# Patient Record
Sex: Female | Born: 1965 | Race: White | Hispanic: No | Marital: Married | Smoking: Never smoker
Health system: Southern US, Community
[De-identification: ages and names within clinical notes are randomized; demographics above are authoritative.]

## PROBLEM LIST (undated history)

## (undated) DIAGNOSIS — T4145XA Adverse effect of unspecified anesthetic, initial encounter: Secondary | ICD-10-CM

## (undated) DIAGNOSIS — T7840XA Allergy, unspecified, initial encounter: Secondary | ICD-10-CM

## (undated) DIAGNOSIS — T8859XA Other complications of anesthesia, initial encounter: Secondary | ICD-10-CM

## (undated) DIAGNOSIS — D649 Anemia, unspecified: Secondary | ICD-10-CM

## (undated) DIAGNOSIS — Z8489 Family history of other specified conditions: Secondary | ICD-10-CM

## (undated) DIAGNOSIS — E079 Disorder of thyroid, unspecified: Secondary | ICD-10-CM

## (undated) DIAGNOSIS — M1990 Unspecified osteoarthritis, unspecified site: Secondary | ICD-10-CM

## (undated) DIAGNOSIS — J45909 Unspecified asthma, uncomplicated: Secondary | ICD-10-CM

## (undated) HISTORY — DX: Disorder of thyroid, unspecified: E07.9

## (undated) HISTORY — PX: SPINE SURGERY: SHX786

## (undated) HISTORY — PX: WISDOM TOOTH EXTRACTION: SHX21

## (undated) HISTORY — DX: Anemia, unspecified: D64.9

## (undated) HISTORY — PX: TRACHEOSTOMY: SUR1362

## (undated) HISTORY — DX: Unspecified asthma, uncomplicated: J45.909

## (undated) HISTORY — DX: Allergy, unspecified, initial encounter: T78.40XA

## (undated) HISTORY — PX: SPINAL FUSION: SHX223

---

## 1997-11-09 ENCOUNTER — Ambulatory Visit (HOSPITAL_COMMUNITY): Admission: RE | Admit: 1997-11-09 | Discharge: 1997-11-09 | Payer: Self-pay | Admitting: Obstetrics and Gynecology

## 1998-02-11 ENCOUNTER — Inpatient Hospital Stay (HOSPITAL_COMMUNITY): Admission: AD | Admit: 1998-02-11 | Discharge: 1998-02-13 | Payer: Self-pay | Admitting: Obstetrics and Gynecology

## 1998-03-23 ENCOUNTER — Other Ambulatory Visit: Admission: RE | Admit: 1998-03-23 | Discharge: 1998-03-23 | Payer: Self-pay | Admitting: Obstetrics and Gynecology

## 1998-09-09 ENCOUNTER — Other Ambulatory Visit: Admission: RE | Admit: 1998-09-09 | Discharge: 1998-09-09 | Payer: Self-pay | Admitting: Obstetrics and Gynecology

## 2002-08-20 ENCOUNTER — Encounter: Payer: Self-pay | Admitting: Family Medicine

## 2002-08-20 ENCOUNTER — Encounter: Admission: RE | Admit: 2002-08-20 | Discharge: 2002-08-20 | Payer: Self-pay | Admitting: Family Medicine

## 2003-09-15 ENCOUNTER — Emergency Department (HOSPITAL_COMMUNITY): Admission: EM | Admit: 2003-09-15 | Discharge: 2003-09-16 | Payer: Self-pay | Admitting: Emergency Medicine

## 2003-11-21 ENCOUNTER — Other Ambulatory Visit: Admission: RE | Admit: 2003-11-21 | Discharge: 2003-11-21 | Payer: Self-pay | Admitting: *Deleted

## 2003-11-21 ENCOUNTER — Other Ambulatory Visit: Admission: RE | Admit: 2003-11-21 | Discharge: 2003-11-21 | Payer: Self-pay | Admitting: Family Medicine

## 2004-04-29 ENCOUNTER — Encounter: Admission: RE | Admit: 2004-04-29 | Discharge: 2004-04-29 | Payer: Self-pay | Admitting: Family Medicine

## 2004-06-30 ENCOUNTER — Encounter: Admission: RE | Admit: 2004-06-30 | Discharge: 2004-06-30 | Payer: Self-pay | Admitting: Gastroenterology

## 2004-07-01 ENCOUNTER — Encounter (INDEPENDENT_AMBULATORY_CARE_PROVIDER_SITE_OTHER): Payer: Self-pay | Admitting: Specialist

## 2004-07-01 ENCOUNTER — Ambulatory Visit (HOSPITAL_COMMUNITY): Admission: RE | Admit: 2004-07-01 | Discharge: 2004-07-01 | Payer: Self-pay | Admitting: Gastroenterology

## 2004-07-19 ENCOUNTER — Encounter: Admission: RE | Admit: 2004-07-19 | Discharge: 2004-07-19 | Payer: Self-pay | Admitting: Gastroenterology

## 2004-12-27 ENCOUNTER — Emergency Department (HOSPITAL_COMMUNITY): Admission: EM | Admit: 2004-12-27 | Discharge: 2004-12-27 | Payer: Self-pay | Admitting: Emergency Medicine

## 2005-04-05 ENCOUNTER — Other Ambulatory Visit: Admission: RE | Admit: 2005-04-05 | Discharge: 2005-04-05 | Payer: Self-pay | Admitting: Family Medicine

## 2006-04-27 ENCOUNTER — Encounter: Admission: RE | Admit: 2006-04-27 | Discharge: 2006-04-27 | Payer: Self-pay | Admitting: Family Medicine

## 2006-05-18 ENCOUNTER — Other Ambulatory Visit: Admission: RE | Admit: 2006-05-18 | Discharge: 2006-05-18 | Payer: Self-pay | Admitting: Family Medicine

## 2007-05-21 ENCOUNTER — Other Ambulatory Visit: Admission: RE | Admit: 2007-05-21 | Discharge: 2007-05-21 | Payer: Self-pay | Admitting: Family Medicine

## 2007-05-24 ENCOUNTER — Encounter: Admission: RE | Admit: 2007-05-24 | Discharge: 2007-05-24 | Payer: Self-pay | Admitting: Family Medicine

## 2008-06-10 ENCOUNTER — Other Ambulatory Visit: Admission: RE | Admit: 2008-06-10 | Discharge: 2008-06-10 | Payer: Self-pay | Admitting: Family Medicine

## 2010-03-04 ENCOUNTER — Ambulatory Visit (HOSPITAL_BASED_OUTPATIENT_CLINIC_OR_DEPARTMENT_OTHER): Admission: RE | Admit: 2010-03-04 | Discharge: 2010-03-04 | Payer: Self-pay | Admitting: Orthopedic Surgery

## 2010-11-05 NOTE — Op Note (Signed)
Tracy Ross, HEINRICHS NO.:  [id number]   MEDICAL RECORD NO.:  [id number]          PATIENT TYPE:  AMB   LOCATION:  ENDO                         FACILITY:  [facility]   PHYSICIAN:  Petra Kuba, M.D.    DATE OF BIRTH:  10/02/1965   DATE OF PROCEDURE:  07/01/2004  DATE OF DISCHARGE:                                 OPERATIVE REPORT   PROCEDURE:  Esophagogastroduodenoscopy with biopsy.   INDICATIONS:  Abdominal pain, nondiagnostic ultrasound and labs.  Consent  was signed after risks, benefits, methods, and options thoroughly discussed  in the office.   MEDICINES USED:  Demerol 25 mg, Versed 4 mg.   PROCEDURE:  The video endoscope was inserted by direct vision.  The  esophagus was normal.  She did have a tiny hiatal hernia.  The scope passed  into the stomach, advanced to the antrum, where some minimal antritis was  seen, through a normal pylorus, into a normal duodenal bulb, and around the  C-loop to a normal second portion of the duodenum.  The scope was withdrawn  back to the bulb and a good look there ruled out ulcers in that location.  The scope was withdrawn back to the stomach and retroflexed.  High in the  cardia, the hiatal hernia was confirmed.  The fundus, angularis, lesser and  greater curve were normal on retroflexed visualization except for some  minimal gastritis.  Straight visualization of the stomach confirmed the  minimal gastritis.  Air was suctioned after two biopsies of the antrum and  two of the proximal stomach were obtained to rule out Helicobacter.  The  scope was slowly withdrawn again.  A good look at the esophagus was normal.  The scope was removed.  The patient tolerated the procedure well.  There was  no obvious immediate complication.   ENDOSCOPIC DIAGNOSES:  1.  Tiny hiatal hernia.  2.  Minimal gastritis and antritis, status post biopsy.  3.  Otherwise normal EGD.   PLAN:  Will await pathology, follow up p.r.n. or in two months.   Otherwise,  continue pump inhibitors.  Consider a CT, PIPIDA, gastric emptying as  possible other test to consider in the future if her problems continue.      MEM/MEDQ  D:  07/01/2004  T:  07/01/2004  Job:  [id number]   cc:   Molly Maduro L. Foy Guadalajara, M.D.  [address] [place]  [place] [address]  Fax: [phone]

## 2010-12-14 ENCOUNTER — Emergency Department (HOSPITAL_COMMUNITY)
Admission: EM | Admit: 2010-12-14 | Discharge: 2010-12-14 | Disposition: A | Discharge status: Home / Self Care | Payer: BC Managed Care – PPO | Attending: Emergency Medicine | Admitting: Emergency Medicine

## 2010-12-14 DIAGNOSIS — T63481A Toxic effect of venom of other arthropod, accidental (unintentional), initial encounter: Secondary | ICD-10-CM | POA: Insufficient documentation

## 2010-12-14 DIAGNOSIS — T6391XA Toxic effect of contact with unspecified venomous animal, accidental (unintentional), initial encounter: Secondary | ICD-10-CM | POA: Insufficient documentation

## 2010-12-14 DIAGNOSIS — L500 Allergic urticaria: Secondary | ICD-10-CM | POA: Insufficient documentation

## 2011-07-14 ENCOUNTER — Ambulatory Visit
Admission: RE | Admit: 2011-07-14 | Discharge: 2011-07-14 | Disposition: A | Discharge status: Home / Self Care | Payer: BC Managed Care – PPO | Source: Ambulatory Visit | Attending: Chiropractic Medicine | Admitting: Chiropractic Medicine

## 2011-07-14 ENCOUNTER — Other Ambulatory Visit: Payer: Self-pay | Admitting: Chiropractic Medicine

## 2011-07-14 ENCOUNTER — Other Ambulatory Visit: Payer: Self-pay | Admitting: Family Medicine

## 2011-07-14 DIAGNOSIS — Z1231 Encounter for screening mammogram for malignant neoplasm of breast: Secondary | ICD-10-CM

## 2011-07-14 DIAGNOSIS — M25551 Pain in right hip: Secondary | ICD-10-CM

## 2011-07-14 DIAGNOSIS — M25659 Stiffness of unspecified hip, not elsewhere classified: Secondary | ICD-10-CM

## 2011-07-28 ENCOUNTER — Ambulatory Visit
Admission: RE | Admit: 2011-07-28 | Discharge: 2011-07-28 | Disposition: A | Discharge status: Home / Self Care | Payer: BC Managed Care – PPO | Source: Ambulatory Visit | Attending: Family Medicine | Admitting: Family Medicine

## 2011-07-28 DIAGNOSIS — Z1231 Encounter for screening mammogram for malignant neoplasm of breast: Secondary | ICD-10-CM

## 2012-05-24 ENCOUNTER — Ambulatory Visit (INDEPENDENT_AMBULATORY_CARE_PROVIDER_SITE_OTHER): Payer: BC Managed Care – PPO | Admitting: Sports Medicine

## 2012-05-24 ENCOUNTER — Encounter: Payer: Self-pay | Admitting: Sports Medicine

## 2012-05-24 VITALS — BP 129/73 | HR 64 | Ht 63.0 in | Wt 139.0 lb

## 2012-05-24 DIAGNOSIS — IMO0002 Reserved for concepts with insufficient information to code with codable children: Secondary | ICD-10-CM

## 2012-05-24 DIAGNOSIS — M5416 Radiculopathy, lumbar region: Secondary | ICD-10-CM | POA: Insufficient documentation

## 2012-05-24 MED ORDER — PREDNISONE 50 MG PO TABS: ORAL_TABLET | ORAL | Status: DC

## 2012-05-24 MED ORDER — MELOXICAM 15 MG PO TABS: ORAL_TABLET | ORAL | Status: DC

## 2012-05-24 MED ORDER — CYCLOBENZAPRINE HCL 10 MG PO TABS: ORAL_TABLET | ORAL | Status: DC

## 2012-05-24 NOTE — Assessment & Plan Note (Signed)
Right-sided L3-L4 lumbar radiculitis, by exam likely affecting the right L3 nerve root. She also has weak abductors on the right side. We will start conservatively, formal physical therapy, prednisone, Flexeril, Mobic. On also like her to work on some specific hip abductor rehabilitation exercises. We will see her back in approximately 4 weeks, and if no better at that point we can consider interventional injection therapy, likely the right L3-L4 level.

## 2012-05-24 NOTE — Patient Instructions (Addendum)
Hip Rehabilitation Protocol:  1.  Side leg raises.  3x30 with no weight, then 3x15 with 2 lb ankle weight, then 3x15 with 5 lb ankle weight 2.  Standing hip rotation.  3x30 with no weight, then 3x15 with 2 lb ankle weight, then 3x15 with 5 lb ankle weight. 3.  Side step ups.  3x30 with no weight, then 3x15 with 5 lbs in backpack, then 3x15 with 10 lbs in backpack. 

## 2012-05-24 NOTE — Progress Notes (Addendum)
SPORTS MEDICINE CONSULTATION REPORT  Subjective:    I'm seeing this patient as a consultation for:  Dr. Foy Guadalajara  CC: Back and hip pain  HPI: This is a very pleasant 46 year old female who comes in with an over one year history of pain she localizes in her low back, predominantly on the right side, it used to radiate around her buttock, and around her anterior thigh. She notes it particularly bad with flexion, squats, and long car rides. It also tends to bother her at night. She also has worse pain with long periods of sitting, and prefers to stand.  She's only been using Motrin, and has never undergone physical therapy, injections, or any steroids. The pain is localized, radiates as above, is moderate  Past medical history, Surgical history, Family history, Social history, Allergies, and medications have been entered into the medical record, reviewed, and no changes needed. No pertinant family history needed.  Review of Systems: No headache, visual changes, nausea, vomiting, diarrhea, constipation, dizziness, abdominal pain, skin rash, fevers, chills, night sweats, weight loss, swollen lymph nodes, body aches, joint swelling, muscle aches, chest pain, shortness of breath, mood changes, visual or auditory hallucinations.   Objective:   Vitals:  Afebrile, vital signs stable. General: Well Developed, well nourished, and in no acute distress.  Neuro/Psych: Alert and oriented x3, extra-ocular muscles intact, able to move all 4 extremities.  Skin: Warm and dry, no rashes noted.  Respiratory: Not using accessory muscles, speaking in full sentences, trachea midline.  Cardiovascular: Pulses palpable, no extremity edema. Abdomen: Does not appear distended. Back Exam:  Inspection: Unremarkable  Motion: Flexion 45 deg, Extension 45 deg, Side Bending to 45 deg bilaterally,  Rotation to 45 deg bilaterally  SLR laying: Negative  XSLR laying: Negative  Palpable tenderness: Bilateral posterior superior  iliac spine.Marland Kitchen FABER: negative. Sensory change: Gross sensation intact to all lumbar and sacral dermatomes.  Reflexes: 2+ at both patellar tendons, 2+ at achilles tendons, Babinski's downgoing.  Strength at foot  Plantar-flexion: 5/5 Dorsi-flexion: 5/5 Eversion: 5/5 Inversion: 5/5  Leg strength  Quad: 5/5 Hamstring: 5/5 Hip flexor: 5/5 Hip abductors: 5/5  Gait unremarkable.  Bilateral Hip: ROM IR: 45 Deg, ER: 45 Deg, Flexion: 120 Deg, Extension: 100 Deg, Abduction: 45 Deg, Adduction: 45 Deg Strength IR: 5/5, ER: 5/5, Flexion: 5/5, Extension: 5/5, hip abduction is very weak on the right side when compared to the left, Adduction: 5/5  Pelvic alignment unremarkable to inspection and palpation. Standing hip rotation and gait without trendelenburg sign / unsteadiness. Greater trochanter without tenderness to palpation. No tenderness over piriformis and greater trochanter. No pain with FABER or FADIR. No SI joint tenderness and normal minimal SI movement.  I did review her MRI, it shows L3-L4 far lateral right sided disc protrusion likely affecting the exiting right L3 nerve root. There is also multilevel facet arthrosis.  She also has a pelvic MRI that shows a small paralabral cyst on her right acetabular rim, there is no degenerative change noted on MRI.  I also reviewed her pelvic x-rays, they show mild shallowness of the right acetabulum, over the superior acetabular rim coverage greater than 50% of the femoral head. This is not representative of developmental hip dysplasia.   Impression and Recommendations:   This case required medical decision making of moderate complexity.

## 2012-05-28 ENCOUNTER — Ambulatory Visit: Payer: BC Managed Care – PPO | Attending: Sports Medicine

## 2012-05-28 DIAGNOSIS — IMO0001 Reserved for inherently not codable concepts without codable children: Secondary | ICD-10-CM | POA: Insufficient documentation

## 2012-05-28 DIAGNOSIS — M545 Low back pain, unspecified: Secondary | ICD-10-CM | POA: Insufficient documentation

## 2012-05-28 DIAGNOSIS — R5381 Other malaise: Secondary | ICD-10-CM | POA: Insufficient documentation

## 2012-05-28 DIAGNOSIS — M25569 Pain in unspecified knee: Secondary | ICD-10-CM | POA: Insufficient documentation

## 2012-05-31 ENCOUNTER — Ambulatory Visit: Payer: BC Managed Care – PPO

## 2012-06-04 ENCOUNTER — Ambulatory Visit: Payer: BC Managed Care – PPO

## 2012-06-14 ENCOUNTER — Other Ambulatory Visit: Payer: Self-pay | Admitting: Sports Medicine

## 2012-06-14 ENCOUNTER — Ambulatory Visit: Payer: BC Managed Care – PPO | Admitting: Physical Therapy

## 2012-06-15 ENCOUNTER — Telehealth: Payer: Self-pay | Admitting: *Deleted

## 2012-06-15 ENCOUNTER — Other Ambulatory Visit: Payer: Self-pay | Admitting: Sports Medicine

## 2012-06-15 NOTE — Telephone Encounter (Signed)
Lets hold off on this for now, the reason being if she is not improved after the initial course of treatment, the next step is delivering a medicine just like prednisone right at the nerve root in the spine. I don't want to give her too much steroid before this and I anticipate this is where we will need to go. She should keep her existing followup visit which should be 4 weeks after her previous visit with me.

## 2012-06-15 NOTE — Telephone Encounter (Signed)
Pt states when she took the Prednisone she felt really well. She states that she started feeling bad after the Prednisone. She is asking if she could take more Prednisone or what you think she do? States her back is feeling better and that it is more her hip. States she can't run on pavement still. Please advise.

## 2012-06-15 NOTE — Telephone Encounter (Signed)
LMOM

## 2012-06-21 ENCOUNTER — Encounter: Payer: Self-pay | Admitting: Sports Medicine

## 2012-06-21 ENCOUNTER — Ambulatory Visit (INDEPENDENT_AMBULATORY_CARE_PROVIDER_SITE_OTHER): Payer: BC Managed Care – PPO | Admitting: Sports Medicine

## 2012-06-21 VITALS — BP 146/83 | HR 75 | Wt 144.0 lb

## 2012-06-21 DIAGNOSIS — IMO0002 Reserved for concepts with insufficient information to code with codable children: Secondary | ICD-10-CM

## 2012-06-21 DIAGNOSIS — M2480 Other specific joint derangements of unspecified joint, not elsewhere classified: Secondary | ICD-10-CM

## 2012-06-21 DIAGNOSIS — M5416 Radiculopathy, lumbar region: Secondary | ICD-10-CM

## 2012-06-21 DIAGNOSIS — M24859 Other specific joint derangements of unspecified hip, not elsewhere classified: Secondary | ICD-10-CM | POA: Insufficient documentation

## 2012-06-21 NOTE — Progress Notes (Signed)
SPORTS MEDICINE CONSULTATION REPORT  Subjective:    CC: Followup  HPI: Right buttock pain: Tracy Ross comes back after formal physical therapy. To recap, she has had an MRI of her pelvis that shows a right para-labial cyst that may have resulted from an old hip labral injury likely from cheerleading. She also has multilevel degenerative as well as facet disease most predominant finding is a right-sided L3-L4 disc protrusion likely affecting the exiting nerve root. She also has multilevel bilateral facet arthrosis particularly at the L4-5 and L5-S1 levels. She localizes her pain deep within the right buttock, and notes it's worse with running, as well as with long car rides and sitting for long periods of time. The pain does radiate down the posterior buttock but not past the knee.  Past medical history, Surgical history, Family history, Social history, Allergies, and medications have been entered into the medical record, reviewed, and no changes needed.   Review of Systems: No headache, visual changes, nausea, vomiting, diarrhea, constipation, dizziness, abdominal pain, skin rash, fevers, chills, night sweats, weight loss, swollen lymph nodes, body aches, joint swelling, muscle aches, chest pain, shortness of breath, mood changes, visual or auditory hallucinations.   Objective:   Vitals:  Afebrile, vital signs stable. General: Well Developed, well nourished, and in no acute distress.  Neuro/Psych: Alert and oriented x3, extra-ocular muscles intact, able to move all 4 extremities.  Skin: Warm and dry, no rashes noted.  Respiratory: Not using accessory muscles, speaking in full sentences, trachea midline.  Cardiovascular: Pulses palpable, no extremity edema. Abdomen: Does not appear distended. Right Hip: ROM IR: 45 Deg, ER: 45 Deg, Flexion: 120 Deg, Extension: 100 Deg, Abduction: 45 Deg, Adduction: 45 Deg Internal rotation at neutral position does not cause pain, but we flex the hip slightly  internally rotate this does reproduce some of her pain. Strength IR: 5/5, ER: 5/5, Flexion: 5/5, Extension: 5/5, Abduction: 5/5, Adduction: 5/5 Pelvic alignment unremarkable to inspection and palpation. Standing hip rotation and gait without trendelenburg sign / unsteadiness. Greater trochanter without tenderness to palpation. No tenderness over piriformis and greater trochanter. No pain with FABER. She does have some reproduction of pain with flexion adduction and internal rotation. No SI joint tenderness and normal minimal SI movement.  Procedure: Real-time Ultrasound Guided Injection of right femoral acetabular joint Device: GE Logiq E  Ultrasound guided injection is preferred based studies that show increased duration, increased effect, greater accuracy, decreased procedural pain, increased response rate, and decreased cost with ultrasound guided versus blind injection.  Verbal informed consent obtained.  Time-out conducted.  Noted no overlying erythema, induration, or other signs of local infection.  Skin prepped in a sterile fashion.  Local anesthesia: Topical Ethyl chloride.  With sterile technique and under real time ultrasound guidance:  Spinal needle guided down to the femoral head/neck junction, 1 cc Kenalog 40, 4 cc lidocaine injected easily. Completed without difficulty  Pain immediately resolved suggesting accurate placement of the medication.  Advised to call if fevers/chills, erythema, induration, drainage, or persistent bleeding.  Images permanently stored and available for review in the ultrasound unit.  Impression: Technically successful ultrasound guided injection.  Impression and Recommendations:   This case required medical decision making of moderate complexity.

## 2012-06-21 NOTE — Assessment & Plan Note (Signed)
Intra-articular injection relieved all symptoms! This confirms hip intra-articular pathology as ain generating structure. Come back to see me in 4 weeks.

## 2012-06-21 NOTE — Assessment & Plan Note (Signed)
We can place this on the back burner for now as the intra-articular hip injection relieved all of her symptoms.

## 2012-07-19 ENCOUNTER — Ambulatory Visit: Payer: BC Managed Care – PPO | Admitting: Sports Medicine

## 2012-12-26 ENCOUNTER — Ambulatory Visit (INDEPENDENT_AMBULATORY_CARE_PROVIDER_SITE_OTHER): Payer: BC Managed Care – PPO | Admitting: Sports Medicine

## 2012-12-26 ENCOUNTER — Encounter: Payer: Self-pay | Admitting: Sports Medicine

## 2012-12-26 VITALS — BP 134/80 | Ht 63.0 in | Wt 135.0 lb

## 2012-12-26 DIAGNOSIS — M5416 Radiculopathy, lumbar region: Secondary | ICD-10-CM

## 2012-12-26 DIAGNOSIS — S76011A Strain of muscle, fascia and tendon of right hip, initial encounter: Secondary | ICD-10-CM

## 2012-12-26 DIAGNOSIS — G8929 Other chronic pain: Secondary | ICD-10-CM

## 2012-12-26 DIAGNOSIS — M24851 Other specific joint derangements of right hip, not elsewhere classified: Secondary | ICD-10-CM

## 2012-12-26 DIAGNOSIS — S76311A Strain of muscle, fascia and tendon of the posterior muscle group at thigh level, right thigh, initial encounter: Secondary | ICD-10-CM

## 2012-12-26 DIAGNOSIS — M2480 Other specific joint derangements of unspecified joint, not elsewhere classified: Secondary | ICD-10-CM

## 2012-12-26 DIAGNOSIS — M25559 Pain in unspecified hip: Secondary | ICD-10-CM

## 2012-12-26 DIAGNOSIS — IMO0002 Reserved for concepts with insufficient information to code with codable children: Secondary | ICD-10-CM

## 2012-12-26 NOTE — Patient Instructions (Addendum)
You have a small tear in your gluteus maximus and piriformis muscles. It is okay to continue activity as long as it does not increase pain. Advil as needed. Start doing exercises at home or gym working up to 3 sets of 15. Then add ankle weights when you are ready. Avoid doing any stretches, yoga, etc that increases front hip pain.

## 2012-12-26 NOTE — Progress Notes (Signed)
CC: Right anterolateral hip pain and posterior buttocks pain HPI: Patient is a 47 y/o female who presents for 1.5 yrs of right anterolateral hip pain and posterior buttocks pain. She states that she thinks the pain started in Nov 2012 when she was doing body pump classes and also training for a half marathon. She states that initially her pain was very severe, but continues to be bothersome and has never resolved. Going up stairs and running on hard surfaces such as pavement make her pain worse. She has taken about 2 weeks off of exercise while her husband was in hospital without any improvement in her pain. She states that she sometimes feels like she gets a hard knot in her buttocks just below her iliac crest on the right. She is going yoga and stretching. Pain does sometimes wake her up at night. She was seen about 6 months ago after undergoing MRI that showed a prelabral cyst and had a intraarticular hip injection that relieved all of her symptoms. She is requesting another injection today. Patient is very concerned because she will be leaving to go hiking in [place] at the end of the month. Of note, MRI lumbar spine at that time showed multilevel degenerative as well as facet disease most predominant finding is a right-sided L3-L4 disc protrusion likely affecting the exiting nerve root. She also has multilevel bilateral facet arthrosis particularly at the L4-5 and L5-S1 levels.   ROS: As above in the HPI. All other systems are stable or negative.  OBJECTIVE: APPEARANCE:  Patient in no acute distress.The patient appeared well nourished and normally developed. HEENT: No scleral icterus. Conjunctiva non-injected RESP: No accessory muscle use, non-labored MSK: Non-antalgic gait. Patient noted to remain standing throughout encounter which she says makes her more comfortable. Right hip: Good ROM in hip flexion, extension, ext rotation. Mild loss of IR due to pain. No TTP of anterior hip joint. Pain  free 5/5 strength on resisted hip flexion, abduction, adduction. 5/5 strength of glut medius. 3/5 strength of glut maximus. TTP of piriformis. There is also TTP in body of right glut maximus with palpable tissue density.   MSK U/S: 1. Right buttocks: There is a visualized hypoechoic area in the body of the right glut maximus with apparent muscle fiber disruption at site of patients pain, visualized in both short and long axis. There is also visualized hypoechoic changed with evidence of muscle splitting in the right piriformis muscle. 2. Right hip joint: There is a small tear in labrum with prelabral cyst. No surrounding hip joint effusion.   ASSESSMENT: 1. Right gluteus maximus muscle tear 2. Right piriformis muscle tear 3. 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 Gluteus medius and piriformis muscle tears. Likely occurred during squatting activities in exercise class. Avoid doing any activities that increase pain. Start HEP to improve gluteus maximus and hip stabilizer strength. Work up to [employer] of 15 and add ankle weights. 2. Right hip prelabral cyst. Do not suspect intraarticular pathology as main source of patient's pain today so recommended against joint injection. Avoid activities with end range internal rotation that cause pain.

## 2012-12-27 DIAGNOSIS — G8929 Other chronic pain: Secondary | ICD-10-CM | POA: Insufficient documentation

## 2012-12-27 DIAGNOSIS — M25559 Pain in unspecified hip: Secondary | ICD-10-CM | POA: Insufficient documentation

## 2012-12-27 NOTE — Assessment & Plan Note (Signed)
This did not seem to contribute to sxs today

## 2012-12-27 NOTE — Assessment & Plan Note (Signed)
See exercise plan  She has signficant weakness that we can correct

## 2012-12-27 NOTE — Assessment & Plan Note (Signed)
Noted on [place] but not symptomatic except on FADIR exam

## 2013-01-14 ENCOUNTER — Ambulatory Visit (INDEPENDENT_AMBULATORY_CARE_PROVIDER_SITE_OTHER): Payer: BC Managed Care – PPO | Admitting: Sports Medicine

## 2013-01-14 VITALS — BP 148/89 | Ht 63.0 in | Wt 135.0 lb

## 2013-01-14 DIAGNOSIS — M5416 Radiculopathy, lumbar region: Secondary | ICD-10-CM

## 2013-01-14 DIAGNOSIS — S76011A Strain of muscle, fascia and tendon of right hip, initial encounter: Secondary | ICD-10-CM

## 2013-01-14 DIAGNOSIS — IMO0002 Reserved for concepts with insufficient information to code with codable children: Secondary | ICD-10-CM

## 2013-01-14 MED ORDER — MELOXICAM 15 MG PO TABS: ORAL_TABLET | ORAL | Status: DC

## 2013-01-14 MED ORDER — PREDNISONE (PAK) 10 MG PO TABS: 10.0000 mg | ORAL_TABLET | Freq: Every day | ORAL | Status: DC

## 2013-01-14 MED ORDER — PREDNISONE 10 MG PO TABS: ORAL_TABLET | ORAL | Status: DC

## 2013-01-14 MED ORDER — METHYLPREDNISOLONE ACETATE 80 MG/ML IJ SUSP
80.0000 mg | Freq: Once | INTRAMUSCULAR | Status: AC
  Administered 2013-01-14: 80 mg via INTRAMUSCULAR

## 2013-01-14 NOTE — Progress Notes (Signed)
  Subjective:    Patient ID: Tracy Ross, female    DOB: [birth year], 47 y.o.   MRN: [id number]  HPI  Patient comes in today with persistent posterior right hip pain. Patient is new to me but is a well-established patient of this clinic. Please see the previous office notes for detailed history. In short, she's had chronic posterior right hip pain. She's had MRI scans of both the right hip as well as her lumbar spine. Her current pain is in the posterior hip and an ultrasound done by Dr. Darrick Penna on July 9th showed tearing of the right gluteus maximus muscle as well as the right piriformis muscle. She was given some home exercises to do. Despite this her symptoms persist. She is leaving for a trip to hike the [facility] later this week and she would be interested in a cortisone injection. She has previously undergone an intra-articular cortisone injection in the same hip but she currently denies any groin pain. Again she localizes all the pain to the posterior hip. Although she has experienced radiating pain down the leg previously she denies any radiculopathy today. Been taking Mobic with some benefit. Her symptoms are most noticeable with prolonged running or with repetitive bending at the waist. No associated numbness or tingling.  Interim medical history is unchanged    Review of Systems     Objective:   Physical Exam Well-developed, well-nourished. No acute distress. Awake alert and oriented x3. Vital signs are reviewed.  Right hip: Smooth painless hip range of motion with a negative log roll in the sitting position. There is tenderness to palpation along the body of the right gluteus maximus as well as some spasm in this area. 3-4/5 strength in the gluteus maximus. 5/5 strength through the rest of the hip. Negative straight leg raise. Walking without significant limp.  MRI scans of her lumbar spine as well as of the right hip are reviewed. Images are dated 09/28/2011. Hip MRI shows  only a tiny para labral cyst. Lumbar spine MRI shows a right-sided disc bulge at L3-L4 which may affect the L3 nerve root. There is also facet arthropathy at L4-L5.       Assessment & Plan:  1. Posterior right hip pain with ultrasound evidence of tearing of the gluteus maximus muscle and piriformis muscle 2. MRI evidence of L4-L5 facet arthropathy and L3-L4 right-sided disc bulge  Patient is injected with 80 mg of Depo-Medrol IM today. I've asked that she continue on her Mobic for a few more days. Continue with her home exercises as well. I've given her a prescription for a 6 day Sterapred Dosepak to take with her on her trip but she will not start this medication unless her symptoms become severe. Followup with Dr. Darrick Penna as scheduled in a few weeks. I do not think her symptoms are originating from her hip so I would not recommend repeating an intra-articular cortisone injection. However, the disc bulge at L3-L4 could be a contributing factor in the chronicity of her symptoms. I've asked her to pay attention to her symptoms and let us know if she gets any radiculopathy down the right leg.

## 2013-02-12 ENCOUNTER — Ambulatory Visit: Payer: BC Managed Care – PPO | Admitting: Sports Medicine

## 2013-03-06 ENCOUNTER — Ambulatory Visit (INDEPENDENT_AMBULATORY_CARE_PROVIDER_SITE_OTHER): Payer: BC Managed Care – PPO | Admitting: Sports Medicine

## 2013-03-06 VITALS — BP 120/60 | Ht 63.0 in

## 2013-03-06 DIAGNOSIS — M5416 Radiculopathy, lumbar region: Secondary | ICD-10-CM

## 2013-03-06 DIAGNOSIS — M24851 Other specific joint derangements of right hip, not elsewhere classified: Secondary | ICD-10-CM

## 2013-03-06 DIAGNOSIS — M25559 Pain in unspecified hip: Secondary | ICD-10-CM

## 2013-03-06 DIAGNOSIS — IMO0002 Reserved for concepts with insufficient information to code with codable children: Secondary | ICD-10-CM

## 2013-03-06 DIAGNOSIS — G8929 Other chronic pain: Secondary | ICD-10-CM

## 2013-03-06 DIAGNOSIS — M2480 Other specific joint derangements of unspecified joint, not elsewhere classified: Secondary | ICD-10-CM

## 2013-03-06 MED ORDER — TRAMADOL HCL 50 MG PO TABS: 50.0000 mg | ORAL_TABLET | Freq: Two times a day (BID) | ORAL | Status: DC | PRN

## 2013-03-06 MED ORDER — NITROGLYCERIN 0.2 MG/HR TD PT24: MEDICATED_PATCH | TRANSDERMAL | Status: DC

## 2013-03-06 NOTE — Assessment & Plan Note (Signed)
This does not seem to be radiating pain on today's visit

## 2013-03-06 NOTE — Patient Instructions (Addendum)
Nitroglycerin Protocol   Apply 1/4 nitroglycerin patch to affected area daily.  Change position of patch within the affected area every 24 hours.  You may experience a headache during the first 1-2 weeks of using the patch, these should subside.  If you experience headaches after beginning nitroglycerin patch treatment, you may take your preferred over the counter pain reliever.  Another side effect of the nitroglycerin patch is skin irritation or rash related to patch adhesive.  Please notify our office if you develop more severe headaches or rash, and stop the patch.  Tendon healing with nitroglycerin patch may require 12 to 24 weeks depending on the extent of injury.  Men should not use if taking Viagra, Cialis, or Levitra.   Do not use if you have migraines or rosacea.   Ok to try massage  Tramadol as needed for pain twice daily  Please follow up in 6 weeks  Thank you for seeing [place] today!

## 2013-03-06 NOTE — Assessment & Plan Note (Signed)
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

## 2013-03-06 NOTE — Progress Notes (Signed)
Patient ID: Tracy Ross, female   DOB: 10-16-1965, 47 y.o.   MRN: [id number]  Patient returns for followup of right hip pain that has been chronic for the last 20 months On Her ultrasound I noted some tearing in the gluteus medius and piriformis on last visit. She did have a right para-labral cyst at her hip but I did not think this seemed to be causing her symptoms. Last year she had a hip joint injection and that helped her symptoms significantly.  Before going on a hiking trip 6 weeks again she also had an injection of cortisone that also relieved a lot of her hip symptoms.  Most of her hip pain is with external rotation and motion and localizes to the lateral hip. She does not have groin pain. She does note that Mobic helps but after a busy day she will also add some Tylenol.  Physical examination No acute distress, athletic female  Range of motion of the left hip is pain free and normal All strength testing on the left is normal  Range of motion of the right hip is normal but she does get some pain on external rotation of the hip that localizes to the greater trochanter Palpation reveals some tenderness over the piriformis and over the upper Glut. Maximus. She has mild tenderness over the tensor fascil lata. Spasm over upper Glut Max Strength on abduction and flexion of the hip are normal With hip extension resisted abduction is weak on the right and somewhat painful Reverse straight leg raises strong TFL testing is strong  Negative straight leg raise No neurologic weakness

## 2013-03-06 NOTE — Assessment & Plan Note (Signed)
I don't think this is the source of her current symptoms as she has less pain with internal rotation  This may cause some limitation of hip flexion on the right

## 2013-04-09 ENCOUNTER — Ambulatory Visit: Payer: BC Managed Care – PPO | Admitting: Sports Medicine

## 2013-06-10 ENCOUNTER — Other Ambulatory Visit: Payer: Self-pay | Admitting: Sports Medicine

## 2013-11-28 ENCOUNTER — Ambulatory Visit (INDEPENDENT_AMBULATORY_CARE_PROVIDER_SITE_OTHER): Payer: BC Managed Care – PPO | Admitting: Sports Medicine

## 2013-11-28 ENCOUNTER — Encounter: Payer: Self-pay | Admitting: Sports Medicine

## 2013-11-28 ENCOUNTER — Ambulatory Visit
Admission: RE | Admit: 2013-11-28 | Discharge: 2013-11-28 | Disposition: A | Discharge status: Home / Self Care | Payer: BC Managed Care – PPO | Source: Ambulatory Visit | Attending: Emergency Medicine | Admitting: Emergency Medicine

## 2013-11-28 VITALS — BP 147/83 | Ht 63.0 in | Wt 135.0 lb

## 2013-11-28 DIAGNOSIS — M25559 Pain in unspecified hip: Secondary | ICD-10-CM

## 2013-11-28 DIAGNOSIS — G8929 Other chronic pain: Secondary | ICD-10-CM

## 2013-11-28 MED ORDER — MELOXICAM 15 MG PO TABS: ORAL_TABLET | ORAL | Status: DC

## 2013-11-28 MED ORDER — METHYLPREDNISOLONE ACETATE 80 MG/ML IJ SUSP
80.0000 mg | Freq: Once | INTRAMUSCULAR | Status: AC
  Administered 2013-11-28: 80 mg via INTRA_ARTICULAR

## 2013-11-28 MED ORDER — METHYLPREDNISOLONE ACETATE 40 MG/ML IJ SUSP: 40.0000 mg | Freq: Once | INTRAMUSCULAR | Status: DC

## 2013-11-28 NOTE — Assessment & Plan Note (Addendum)
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  Will repeat xrays today.  Injection provided immediate improvement in symptoms.  Will review x-rays with patient by phone or at next appointment.  Mobic refilled today.

## 2013-11-28 NOTE — Progress Notes (Signed)
Patient ID: Tracy Ross, female   DOB: 10-06-1965, 48 y.o.   MRN: [id number] Follow up of right hip pain which has been chronic for several years.  Limits her ability to run secondary to pain.  Mild relief with dry-needling recently.  Continues yoga and stretching, but has not improved internal range of motion.  She is unable to cross right leg over left.  Pain in groin as well as gluteal region.  Nitro topically did not improve symptoms.  Mild relief with Mobic, however running out of it.  PPMH:  Chronic hip pain, negative for diabetes  SH:  Nonsmoker, occasional alcohol  ROS:  As per HPI, otherwise all systems negative.  Examination: BP 147/83  Ht 5\' 3"  (1.6 m)  Wt 135 lb (61.236 kg)  BMI 23.92 kg/m2 Well developed, well nourished 48 yo female A&Ox3 Right Hip: ROM:  Limited with IR to midline, ER to 80+ Strength Flexion: 5/5, Extension: 5/5, Abduction: 5/5, Adduction: 5/5 Pelvic alignment unremarkable to inspection and palpation. Standing hip rotation and gait with trendelenburg to right Greater trochanter without tenderness to palpation. No tenderness over piriformis and greater trochanter. No SI joint tenderness and normal minimal SI movement. FABER negative/FADIR limited secondary to mechanical decrease in ROM, positive pain.  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  Procedure:  Injection of right hip under ultrasound guidance to confirm needle placement. Consent obtained and verified. Time-out conducted. Noted no overlying erythema, induration, or other signs of local infection. Skin prepped in a sterile fashion. Topical analgesic spray: Ethyl chloride. Local anesthetic 3 cc lido. Completed without difficulty. Meds: 80mg  depo, 6 cc lido Pain immediately improved suggesting accurate placement of the medication. Advised to call if fevers/chills, erythema, induration, drainage, or persistent  bleeding. Images obtained of femoroacetabular joint and needle within the joint capsule during injection.

## 2013-12-02 ENCOUNTER — Other Ambulatory Visit: Payer: Self-pay | Admitting: Family Medicine

## 2013-12-02 DIAGNOSIS — Z1231 Encounter for screening mammogram for malignant neoplasm of breast: Secondary | ICD-10-CM

## 2013-12-19 ENCOUNTER — Ambulatory Visit: Payer: BC Managed Care – PPO

## 2013-12-19 ENCOUNTER — Ambulatory Visit
Admission: RE | Admit: 2013-12-19 | Discharge: 2013-12-19 | Disposition: A | Discharge status: Home / Self Care | Payer: BC Managed Care – PPO | Source: Ambulatory Visit | Attending: Family Medicine | Admitting: Family Medicine

## 2013-12-19 DIAGNOSIS — Z1231 Encounter for screening mammogram for malignant neoplasm of breast: Secondary | ICD-10-CM

## 2014-03-25 ENCOUNTER — Encounter: Payer: Self-pay | Admitting: Sports Medicine

## 2014-03-25 ENCOUNTER — Ambulatory Visit (INDEPENDENT_AMBULATORY_CARE_PROVIDER_SITE_OTHER): Payer: BC Managed Care – PPO | Admitting: Sports Medicine

## 2014-03-25 VITALS — BP 150/87 | HR 80 | Ht 63.0 in | Wt 135.0 lb

## 2014-03-25 DIAGNOSIS — M25551 Pain in right hip: Secondary | ICD-10-CM | POA: Diagnosis not present

## 2014-03-25 DIAGNOSIS — G8929 Other chronic pain: Secondary | ICD-10-CM | POA: Diagnosis not present

## 2014-03-25 DIAGNOSIS — M4310 Spondylolisthesis, site unspecified: Secondary | ICD-10-CM | POA: Diagnosis not present

## 2014-03-25 DIAGNOSIS — M1611 Unilateral primary osteoarthritis, right hip: Secondary | ICD-10-CM | POA: Diagnosis not present

## 2014-03-25 MED ORDER — METHYLPREDNISOLONE ACETATE 80 MG/ML IJ SUSP
80.0000 mg | Freq: Once | INTRAMUSCULAR | Status: AC
  Administered 2014-03-25: 80 mg via INTRA_ARTICULAR

## 2014-03-25 NOTE — Patient Instructions (Addendum)
It was great to see you today! Call the office if you have any questions [phone] [facility] [address] [place] [phone] Wednesday 10/14 @ 845a

## 2014-03-25 NOTE — Progress Notes (Signed)
Patient ID: Tracy Ross, female   DOB: 04/22/1966, 48 y.o.   MRN: [id number] Patient is a 48 year old female presents today for followup right hip pain -This has been an issue for several years the patient has known Tracy Ross arthritis of the right hip. -Patient was seen back in June 2015 and had a intra-articular hip injection which he found great clinical benefit allow [place] weeks after the injection. -Patient also has known history of multilevel lumbar retrolisthesis with disc bulge seen on MRI 2013 the patient has not been symptomatic with no radicular symptoms or back pain. -Concerning her hip pain she reports that she continues to have deep seated pain localized to the anterior right hip. Describes it as a intermittent sharp pain with continuous ache. -She's an avid runner but a scale back her running to an elliptical-type machine and only runs 3-4 miles 2 days a week. He has not been in Neurontin over week because of this continued hip pain -She's been on Mobic daily and also will take Tylenol before bed for additional pain control -Pain will wake her up in the middle of night -Patient is interested in being in seen by orthopedic surgeon for consideration of possible hip replacement.  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  Examination: BP 150/87  Pulse 80  Ht 5\' 3"  (1.6 m)  Wt 135 lb (61.236 kg)  BMI 23.92 kg/m2 Well developed, well nourished 48 yo female A&Ox3 Right Hip: ROM:  Limited with IR to midline, ER to 80+ Strength - Flexion: 5/5, Extension: 5/5, Abduction: 5/5, Adduction: 5/5 Pelvic alignment unremarkable to inspection and palpation. Standing hip rotation and gait with trendelenburg to right No tenderness over piriformis and greater trochanter. No SI joint tenderness and normal minimal SI  movement. FABER negative/FADIR limited secondary to mechanical decrease in ROM, positive pain.  Lumbar spine has obvious step off defects noted at L4-L5. Patient has no radicular symptoms. Negative straight leg raise for pain or radiculopathy. Normal sensation bilateral extremities Nerve root intervention  L2 and L3: Normal hip flexion with no weakness. L2, L3, L4: Normal hip abduction bilaterally.  Normal patellar DTR +3 bilaterally L4, L5, S1: Normal hip abduction bilaterally S1 and S2: Normal ankle plantar-flexion bilaterally.  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 Consent obtained and verified. Time-out conducted. Noted no overlying erythema, induration, or other signs of local infection. Skin prepped in a sterile fashion. Topical analgesic spray: Ethyl chloride. Local anesthetic 3 cc lido. Completed without difficulty. Meds: 80mg  depo, 4 cc lido Pain immediately improved suggesting accurate placement of the medication. Advised to call if fevers/chills, erythema, induration, drainage, or persistent bleeding. Images obtained of femoroacetabular joint and needle within the joint capsule during injection.

## 2014-03-25 NOTE — Assessment & Plan Note (Signed)
Patient has known retrolisthesis of the lumbar spine seen on MRI in 2013. Patient not having any clinical symptoms of radiculopathy or pain in her back secondary to these chronic changes.  Recommendations: Encouraged patient to work on flexion type exercises And to followup if she develops any radiculopathy or pain in the future

## 2014-03-25 NOTE — Assessment & Plan Note (Signed)
Some patient's x-ray, history, exam and concern for persistent primary arthritis of the right hip possibly grade 4 based on the eighth cystic erosive changes.  Recommendations -Referral to Dr. Magnus [facility]Blackman at [facility] for surgical consultation -Will repeat intra-articular hip injection provided patient with therapeutic relief from pain -Can continue working on strengthening exercises with physical therapy -Continue Mobic when necessary for pain control

## 2014-04-07 ENCOUNTER — Other Ambulatory Visit: Payer: Self-pay | Admitting: Orthopaedic Surgery

## 2014-04-07 DIAGNOSIS — M25551 Pain in right hip: Secondary | ICD-10-CM

## 2014-04-22 ENCOUNTER — Ambulatory Visit
Admission: RE | Admit: 2014-04-22 | Discharge: 2014-04-22 | Disposition: A | Discharge status: Home / Self Care | Payer: BC Managed Care – PPO | Source: Ambulatory Visit | Attending: Orthopaedic Surgery | Admitting: Orthopaedic Surgery

## 2014-04-22 DIAGNOSIS — M25551 Pain in right hip: Secondary | ICD-10-CM

## 2014-04-22 MED ORDER — IOHEXOL 180 MG/ML  SOLN
8.0000 mL | Freq: Once | INTRAMUSCULAR | Status: AC | PRN
  Administered 2014-04-22: 8 mL via INTRA_ARTICULAR

## 2014-05-26 ENCOUNTER — Other Ambulatory Visit (HOSPITAL_COMMUNITY): Payer: Self-pay | Admitting: Orthopaedic Surgery

## 2014-05-31 NOTE — Pre-Procedure Instructions (Signed)
Tracy CokeJacqueline M Ross  05/31/2014   Your procedure is scheduled on:  December 22  [facility] at 12:15 PM.  Call this number if you have problems the morning of surgery: [phone]   Remember:   Do not eat food or drink liquids after midnight.   Take these medicines the morning of surgery with A SIP OF WATER: Wellbutrin,    STOP Biotin, Meloxicam, Multiple vitamins today   STOP/ Do not take Aspirin, Aleve, Naproxen, Advil, Ibuprofen, Motrin, Vitamins, Herbs, or Supplements starting today   Do not wear jewelry, make-up or nail polish.  Do not wear lotions, powders, or perfumes. You may wear deodorant.  Do not shave 48 hours prior to surgery. Men may shave face and neck.  Do not bring valuables to the hospital.  [facility] is not responsible for any belongings or valuables.               Contacts, dentures or bridgework may not be worn into surgery.  Leave suitcase in the car. After surgery it may be brought to your room.  For patients admitted to the hospital, discharge time is determined by your treatment team.               Special Instructions: [place] - Preparing for Surgery  Before surgery, you can play an important role.  Because skin is not sterile, your skin needs to be as free of germs as possible.  You can reduce the number of germs on you skin by washing with CHG (chlorahexidine gluconate) soap before surgery.  CHG is an antiseptic cleaner which kills germs and bonds with the skin to continue killing germs even after washing.  Please DO NOT use if you have an allergy to CHG or antibacterial soaps.  If your skin becomes reddened/irritated stop using the CHG and inform your nurse when you arrive at Short Stay.  Do not shave (including legs and underarms) for at least 48 hours prior to the first CHG shower.  You may shave your face.  Please follow these instructions carefully:   1.  Shower with CHG Soap the night before surgery and the  morning of Surgery.  2.  If you choose to wash your hair, wash your hair first as usual with your normal shampoo.  3.  After you shampoo, rinse your hair and body thoroughly to remove the shampoo.  4.  Use CHG as you would any other liquid soap.  You can apply CHG directly to the skin and wash gently with scrungie or a clean washcloth.  5.  Apply the CHG Soap to your body ONLY FROM THE NECK DOWN.  Do not use on open wounds or open sores.  Avoid contact with your eyes, ears, mouth and genitals (private parts).  Wash genitals (private parts) with your normal soap.  6.  Wash thoroughly, paying special attention to the area where your surgery will be performed.  7.  Thoroughly rinse your body with warm water from the neck down.  8.  DO NOT shower/wash with your normal soap after using and rinsing off the CHG Soap.  9.  Pat yourself dry with a clean towel.            10.  Wear clean pajamas.            11.  Place clean sheets on your bed the night of your first shower and do not sleep with pets.  Day of Surgery  Do not apply any lotions the morning of surgery.  Please wear clean clothes to the hospital/surgery center.     Please read over the following fact sheets that you were given: Pain Booklet, Coughing and Deep Breathing and Surgical Site Infection Prevention

## 2014-06-02 ENCOUNTER — Encounter (HOSPITAL_COMMUNITY)
Admission: RE | Admit: 2014-06-02 | Discharge: 2014-06-02 | Disposition: A | Discharge status: Home / Self Care | Payer: BC Managed Care – PPO | Source: Ambulatory Visit | Attending: Orthopaedic Surgery | Admitting: Orthopaedic Surgery

## 2014-06-02 ENCOUNTER — Encounter (HOSPITAL_COMMUNITY): Payer: Self-pay

## 2014-06-02 DIAGNOSIS — R001 Bradycardia, unspecified: Secondary | ICD-10-CM | POA: Diagnosis not present

## 2014-06-02 DIAGNOSIS — Z01818 Encounter for other preprocedural examination: Secondary | ICD-10-CM | POA: Insufficient documentation

## 2014-06-02 DIAGNOSIS — J45909 Unspecified asthma, uncomplicated: Secondary | ICD-10-CM | POA: Insufficient documentation

## 2014-06-02 HISTORY — DX: Unspecified osteoarthritis, unspecified site: M19.90

## 2014-06-02 HISTORY — DX: Adverse effect of unspecified anesthetic, initial encounter: T41.45XA

## 2014-06-02 HISTORY — DX: Other complications of anesthesia, initial encounter: T88.59XA

## 2014-06-02 HISTORY — DX: Family history of other specified conditions: Z84.89

## 2014-06-02 LAB — URINALYSIS, ROUTINE W REFLEX MICROSCOPIC
Bilirubin Urine: NEGATIVE
Glucose, UA: NEGATIVE mg/dL
Hgb urine dipstick: NEGATIVE
Ketones, ur: NEGATIVE mg/dL
Leukocytes, UA: NEGATIVE
Nitrite: NEGATIVE
PROTEIN: NEGATIVE mg/dL
Specific Gravity, Urine: 1.004 — ABNORMAL LOW (ref 1.005–1.030)
UROBILINOGEN UA: 0.2 mg/dL (ref 0.0–1.0)
pH: 7 (ref 5.0–8.0)

## 2014-06-02 LAB — BASIC METABOLIC PANEL
ANION GAP: 13 (ref 5–15)
BUN: 17 mg/dL (ref 6–23)
CALCIUM: 9.4 mg/dL (ref 8.4–10.5)
CHLORIDE: 106 meq/L (ref 96–112)
CO2: 22 meq/L (ref 19–32)
Creatinine, Ser: 0.7 mg/dL (ref 0.50–1.10)
GFR calc Af Amer: >90 mL/min (ref >90)
GFR calc non Af Amer: >90 mL/min (ref >90)
Glucose, Bld: 86 mg/dL (ref 70–99)
Potassium: 4.6 mEq/L (ref 3.7–5.3)
Sodium: 141 mEq/L (ref 137–147)

## 2014-06-02 LAB — HCG, SERUM, QUALITATIVE: Preg, Serum: NEGATIVE

## 2014-06-02 LAB — CBC
HEMATOCRIT: 40.3 % (ref 36.0–46.0)
HEMOGLOBIN: 13.9 g/dL (ref 12.0–15.0)
MCH: 34.2 pg — AB (ref 26.0–34.0)
MCHC: 34.5 g/dL (ref 30.0–36.0)
MCV: 99 fL (ref 78.0–100.0)
Platelets: 305 10*3/uL (ref 150–400)
RBC: 4.07 MIL/uL (ref 3.87–5.11)
RDW: 12.5 % (ref 11.5–15.5)
WBC: 5.2 10*3/uL (ref 4.0–10.5)

## 2014-06-02 LAB — APTT: aPTT: 33 seconds (ref 24–37)

## 2014-06-02 LAB — SURGICAL PCR SCREEN
MRSA, PCR: NEGATIVE
Staphylococcus aureus: POSITIVE — AB

## 2014-06-02 LAB — PROTIME-INR
INR: 1.03 (ref 0.00–1.49)
PROTHROMBIN TIME: 13.6 s (ref 11.6–15.2)

## 2014-06-09 MED ORDER — CEFAZOLIN SODIUM-DEXTROSE 2-3 GM-% IV SOLR
2.0000 g | INTRAVENOUS | Status: AC
  Administered 2014-06-10: 2 g via INTRAVENOUS
  Filled 2014-06-09: qty 50

## 2014-06-10 ENCOUNTER — Inpatient Hospital Stay (HOSPITAL_COMMUNITY): Payer: BC Managed Care – PPO

## 2014-06-10 ENCOUNTER — Inpatient Hospital Stay (HOSPITAL_COMMUNITY): Payer: BC Managed Care – PPO | Admitting: Certified Registered Nurse Anesthetist

## 2014-06-10 ENCOUNTER — Encounter (HOSPITAL_COMMUNITY)
Admission: RE | Disposition: A | Discharge status: Home / Self Care | Payer: Self-pay | Source: Ambulatory Visit | Attending: Orthopaedic Surgery

## 2014-06-10 ENCOUNTER — Inpatient Hospital Stay (HOSPITAL_COMMUNITY)
Admission: RE | Admit: 2014-06-10 | Discharge: 2014-06-11 | DRG: 470 | Disposition: A | Discharge status: Home / Self Care | Payer: BC Managed Care – PPO | Source: Ambulatory Visit | Attending: Orthopaedic Surgery | Admitting: Orthopaedic Surgery

## 2014-06-10 ENCOUNTER — Encounter (HOSPITAL_COMMUNITY): Payer: Self-pay | Admitting: *Deleted

## 2014-06-10 DIAGNOSIS — M1611 Unilateral primary osteoarthritis, right hip: Secondary | ICD-10-CM | POA: Diagnosis present

## 2014-06-10 DIAGNOSIS — D62 Acute posthemorrhagic anemia: Secondary | ICD-10-CM | POA: Diagnosis not present

## 2014-06-10 DIAGNOSIS — Z91030 Bee allergy status: Secondary | ICD-10-CM | POA: Diagnosis not present

## 2014-06-10 DIAGNOSIS — J45909 Unspecified asthma, uncomplicated: Secondary | ICD-10-CM | POA: Diagnosis present

## 2014-06-10 DIAGNOSIS — Z419 Encounter for procedure for purposes other than remedying health state, unspecified: Secondary | ICD-10-CM

## 2014-06-10 DIAGNOSIS — Z96641 Presence of right artificial hip joint: Secondary | ICD-10-CM

## 2014-06-10 DIAGNOSIS — M25551 Pain in right hip: Secondary | ICD-10-CM | POA: Diagnosis present

## 2014-06-10 HISTORY — PX: TOTAL HIP ARTHROPLASTY: SHX124

## 2014-06-10 SURGERY — ARTHROPLASTY, HIP, TOTAL, ANTERIOR APPROACH
Anesthesia: Monitor Anesthesia Care | Site: Hip | Laterality: Right

## 2014-06-10 MED ORDER — MIDAZOLAM HCL 2 MG/2ML IJ SOLN
INTRAMUSCULAR | Status: AC
  Filled 2014-06-10: qty 2

## 2014-06-10 MED ORDER — OXYCODONE HCL 5 MG PO TABS
5.0000 mg | ORAL_TABLET | ORAL | Status: DC | PRN
  Administered 2014-06-10 – 2014-06-11 (×5): 10 mg via ORAL
  Filled 2014-06-10 (×5): qty 2

## 2014-06-10 MED ORDER — ONDANSETRON HCL 4 MG/2ML IJ SOLN
4.0000 mg | Freq: Four times a day (QID) | INTRAMUSCULAR | Status: DC | PRN
Start: 2014-06-10 — End: 2014-06-11

## 2014-06-10 MED ORDER — PROPOFOL 10 MG/ML IV BOLUS
INTRAVENOUS | Status: AC
  Filled 2014-06-10: qty 20

## 2014-06-10 MED ORDER — PHENOL 1.4 % MT LIQD: 1.0000 | OROMUCOSAL | Status: DC | PRN

## 2014-06-10 MED ORDER — SODIUM CHLORIDE 0.9 % IV SOLN
1000.0000 mg | INTRAVENOUS | Status: AC
  Administered 2014-06-10: 1000 mg via INTRAVENOUS
  Filled 2014-06-10 (×2): qty 10

## 2014-06-10 MED ORDER — DIPHENHYDRAMINE HCL 12.5 MG/5ML PO ELIX: 12.5000 mg | ORAL_SOLUTION | ORAL | Status: DC | PRN

## 2014-06-10 MED ORDER — ALBUTEROL SULFATE (2.5 MG/3ML) 0.083% IN NEBU
INHALATION_SOLUTION | RESPIRATORY_TRACT | Status: AC
  Filled 2014-06-10: qty 3

## 2014-06-10 MED ORDER — SODIUM CHLORIDE 0.9 % IV SOLN
INTRAVENOUS | Status: DC
  Administered 2014-06-10: 20:00:00 via INTRAVENOUS

## 2014-06-10 MED ORDER — METOCLOPRAMIDE HCL 10 MG PO TABS
5.0000 mg | ORAL_TABLET | Freq: Three times a day (TID) | ORAL | Status: DC | PRN
Start: 2014-06-10 — End: 2014-06-11

## 2014-06-10 MED ORDER — LIDOCAINE HCL (CARDIAC) 20 MG/ML IV SOLN
INTRAVENOUS | Status: DC | PRN
  Administered 2014-06-10: 20 mg via INTRAVENOUS

## 2014-06-10 MED ORDER — EPHEDRINE SULFATE 50 MG/ML IJ SOLN
INTRAMUSCULAR | Status: DC | PRN
  Administered 2014-06-10 (×9): 5 mg via INTRAVENOUS

## 2014-06-10 MED ORDER — METHOCARBAMOL 1000 MG/10ML IJ SOLN
500.0000 mg | Freq: Four times a day (QID) | INTRAVENOUS | Status: DC | PRN
  Filled 2014-06-10: qty 5

## 2014-06-10 MED ORDER — BUPROPION HCL ER (XL) 300 MG PO TB24
300.0000 mg | ORAL_TABLET | Freq: Every day | ORAL | Status: DC
  Administered 2014-06-11: 300 mg via ORAL
  Filled 2014-06-10: qty 1

## 2014-06-10 MED ORDER — DIPHENHYDRAMINE HCL 50 MG/ML IJ SOLN
INTRAMUSCULAR | Status: DC | PRN
  Administered 2014-06-10: 12.5 mg via INTRAVENOUS
  Administered 2014-06-10: 25 mg via INTRAVENOUS
  Administered 2014-06-10: 12.5 mg via INTRAVENOUS

## 2014-06-10 MED ORDER — FENTANYL CITRATE 0.05 MG/ML IJ SOLN
INTRAMUSCULAR | Status: AC
  Filled 2014-06-10: qty 5

## 2014-06-10 MED ORDER — FERROUS SULFATE 325 (65 FE) MG PO TABS
325.0000 mg | ORAL_TABLET | Freq: Three times a day (TID) | ORAL | Status: DC
  Administered 2014-06-10: 325 mg via ORAL
  Filled 2014-06-10 (×5): qty 1

## 2014-06-10 MED ORDER — ZOLPIDEM TARTRATE 5 MG PO TABS
5.0000 mg | ORAL_TABLET | Freq: Every evening | ORAL | Status: DC | PRN
  Administered 2014-06-10: 5 mg via ORAL
  Filled 2014-06-10: qty 1

## 2014-06-10 MED ORDER — ASPIRIN EC 325 MG PO TBEC
325.0000 mg | DELAYED_RELEASE_TABLET | Freq: Two times a day (BID) | ORAL | Status: DC
  Administered 2014-06-11: 325 mg via ORAL
  Filled 2014-06-10 (×3): qty 1

## 2014-06-10 MED ORDER — METHOCARBAMOL 500 MG PO TABS
500.0000 mg | ORAL_TABLET | Freq: Four times a day (QID) | ORAL | Status: DC | PRN
Start: 2014-06-10 — End: 2014-06-11
  Administered 2014-06-10 – 2014-06-11 (×2): 500 mg via ORAL
  Filled 2014-06-10 (×3): qty 1

## 2014-06-10 MED ORDER — DOCUSATE SODIUM 100 MG PO CAPS
100.0000 mg | ORAL_CAPSULE | Freq: Two times a day (BID) | ORAL | Status: DC
Start: 2014-06-10 — End: 2014-06-11
  Administered 2014-06-10 – 2014-06-11 (×2): 100 mg via ORAL
  Filled 2014-06-10 (×2): qty 1

## 2014-06-10 MED ORDER — PROPOFOL INFUSION 10 MG/ML OPTIME
INTRAVENOUS | Status: DC | PRN
  Administered 2014-06-10: 75 ug/kg/min via INTRAVENOUS

## 2014-06-10 MED ORDER — CLINDAMYCIN PHOSPHATE 900 MG/50ML IV SOLN
900.0000 mg | Freq: Three times a day (TID) | INTRAVENOUS | Status: AC
  Administered 2014-06-10 – 2014-06-11 (×2): 900 mg via INTRAVENOUS
  Filled 2014-06-10 (×2): qty 50

## 2014-06-10 MED ORDER — HYDROMORPHONE HCL 1 MG/ML IJ SOLN: 1.0000 mg | INTRAMUSCULAR | Status: DC | PRN

## 2014-06-10 MED ORDER — POLYETHYLENE GLYCOL 3350 17 G PO PACK: 17.0000 g | PACK | Freq: Every day | ORAL | Status: DC | PRN

## 2014-06-10 MED ORDER — FAMOTIDINE IN NACL 20-0.9 MG/50ML-% IV SOLN
20.0000 mg | Freq: Once | INTRAVENOUS | Status: AC
  Administered 2014-06-10: 20 mg via INTRAVENOUS
  Filled 2014-06-10: qty 50

## 2014-06-10 MED ORDER — ALUM & MAG HYDROXIDE-SIMETH 200-200-20 MG/5ML PO SUSP: 30.0000 mL | ORAL | Status: DC | PRN

## 2014-06-10 MED ORDER — MIDAZOLAM HCL 5 MG/5ML IJ SOLN
INTRAMUSCULAR | Status: DC | PRN
  Administered 2014-06-10: 2 mg via INTRAVENOUS

## 2014-06-10 MED ORDER — METOCLOPRAMIDE HCL 5 MG/ML IJ SOLN
5.0000 mg | Freq: Three times a day (TID) | INTRAMUSCULAR | Status: DC | PRN
Start: 2014-06-10 — End: 2014-06-11

## 2014-06-10 MED ORDER — ADULT MULTIVITAMIN W/MINERALS CH
1.0000 | ORAL_TABLET | Freq: Every day | ORAL | Status: DC
  Administered 2014-06-10 – 2014-06-11 (×2): 1 via ORAL
  Filled 2014-06-10 (×2): qty 1

## 2014-06-10 MED ORDER — LACTATED RINGERS IV SOLN
INTRAVENOUS | Status: DC | PRN
  Administered 2014-06-10 (×2): via INTRAVENOUS

## 2014-06-10 MED ORDER — HYDROMORPHONE HCL 1 MG/ML IJ SOLN
INTRAMUSCULAR | Status: AC
  Filled 2014-06-10: qty 1

## 2014-06-10 MED ORDER — HYDROMORPHONE HCL 1 MG/ML IJ SOLN
0.2500 mg | INTRAMUSCULAR | Status: DC | PRN
Start: 2014-06-10 — End: 2014-06-10
  Administered 2014-06-10: 1 mg via INTRAVENOUS

## 2014-06-10 MED ORDER — ALBUTEROL SULFATE (2.5 MG/3ML) 0.083% IN NEBU
2.5000 mg | INHALATION_SOLUTION | RESPIRATORY_TRACT | Status: DC | PRN
  Filled 2014-06-10: qty 3

## 2014-06-10 MED ORDER — MENTHOL 3 MG MT LOZG: 1.0000 | LOZENGE | OROMUCOSAL | Status: DC | PRN

## 2014-06-10 MED ORDER — GLYCOPYRROLATE 0.2 MG/ML IJ SOLN
INTRAMUSCULAR | Status: DC | PRN
  Administered 2014-06-10 (×2): .1 mg via INTRAVENOUS

## 2014-06-10 MED ORDER — ALBUMIN HUMAN 5 % IV SOLN
INTRAVENOUS | Status: DC | PRN
  Administered 2014-06-10 (×2): via INTRAVENOUS

## 2014-06-10 MED ORDER — PROPOFOL 10 MG/ML IV BOLUS
INTRAVENOUS | Status: DC | PRN
  Administered 2014-06-10: 20 mg via INTRAVENOUS
  Administered 2014-06-10: 30 mg via INTRAVENOUS

## 2014-06-10 MED ORDER — LACTATED RINGERS IV SOLN
INTRAVENOUS | Status: DC
  Administered 2014-06-10: 13:00:00 via INTRAVENOUS

## 2014-06-10 MED ORDER — MIDAZOLAM HCL 2 MG/2ML IJ SOLN
2.0000 mg | Freq: Four times a day (QID) | INTRAMUSCULAR | Status: DC | PRN
  Administered 2014-06-10: 2 mg via INTRAVENOUS

## 2014-06-10 MED ORDER — ONDANSETRON HCL 4 MG PO TABS: 4.0000 mg | ORAL_TABLET | Freq: Four times a day (QID) | ORAL | Status: DC | PRN

## 2014-06-10 MED ORDER — PHENYLEPHRINE HCL 10 MG/ML IJ SOLN
INTRAMUSCULAR | Status: DC | PRN
  Administered 2014-06-10: 80 ug via INTRAVENOUS
  Administered 2014-06-10: 40 ug via INTRAVENOUS
  Administered 2014-06-10 (×2): 80 ug via INTRAVENOUS
  Administered 2014-06-10 (×2): 40 ug via INTRAVENOUS

## 2014-06-10 MED ORDER — ACETAMINOPHEN 325 MG PO TABS: 650.0000 mg | ORAL_TABLET | Freq: Four times a day (QID) | ORAL | Status: DC | PRN

## 2014-06-10 MED ORDER — FENTANYL CITRATE 0.05 MG/ML IJ SOLN
INTRAMUSCULAR | Status: DC | PRN
  Administered 2014-06-10: 25 ug via INTRAVENOUS
  Administered 2014-06-10: 50 ug via INTRAVENOUS

## 2014-06-10 MED ORDER — 0.9 % SODIUM CHLORIDE (POUR BTL) OPTIME
TOPICAL | Status: DC | PRN
  Administered 2014-06-10: 1000 mL

## 2014-06-10 MED ORDER — ALBUTEROL SULFATE HFA 108 (90 BASE) MCG/ACT IN AERS
1.0000 | INHALATION_SPRAY | RESPIRATORY_TRACT | Status: DC | PRN
  Administered 2014-06-10: 2 via RESPIRATORY_TRACT
  Filled 2014-06-10: qty 6.7

## 2014-06-10 MED ORDER — ONDANSETRON HCL 4 MG/2ML IJ SOLN: 4.0000 mg | Freq: Once | INTRAMUSCULAR | Status: DC | PRN

## 2014-06-10 MED ORDER — ACETAMINOPHEN 650 MG RE SUPP: 650.0000 mg | Freq: Four times a day (QID) | RECTAL | Status: DC | PRN

## 2014-06-10 MED ORDER — SODIUM CHLORIDE 0.9 % IR SOLN
Status: DC | PRN
  Administered 2014-06-10: 1000 mL

## 2014-06-10 SURGICAL SUPPLY — 57 items
APL SKNCLS STERI-STRIP NONHPOA (GAUZE/BANDAGES/DRESSINGS) ×1
BENZOIN TINCTURE PRP APPL 2/3 (GAUZE/BANDAGES/DRESSINGS) ×3 IMPLANT
BLADE SAW SGTL 18X1.27X75 (BLADE) ×2 IMPLANT
BLADE SAW SGTL 18X1.27X75MM (BLADE) ×1
BLADE SURG ROTATE 9660 (MISCELLANEOUS) IMPLANT
BNDG GAUZE ELAST 4 BULKY (GAUZE/BANDAGES/DRESSINGS) IMPLANT
CAPT HIP TOTAL 2 ×2 IMPLANT
CELLS DAT CNTRL 66122 CELL SVR (MISCELLANEOUS) ×1 IMPLANT
CLOSURE WOUND 1/2 X4 (GAUZE/BANDAGES/DRESSINGS) ×2
COVER SURGICAL LIGHT HANDLE (MISCELLANEOUS) ×3 IMPLANT
DRAPE C-ARM 42X72 X-RAY (DRAPES) ×3 IMPLANT
DRAPE IMP U-DRAPE 54X76 (DRAPES) ×3 IMPLANT
DRAPE STERI IOBAN 125X83 (DRAPES) ×3 IMPLANT
DRAPE U-SHAPE 47X51 STRL (DRAPES) ×9 IMPLANT
DRSG AQUACEL AG ADV 3.5X10 (GAUZE/BANDAGES/DRESSINGS) ×3 IMPLANT
DRSG AQUACEL AG ADV 3.5X14 (GAUZE/BANDAGES/DRESSINGS) ×2 IMPLANT
DURAPREP 26ML APPLICATOR (WOUND CARE) ×3 IMPLANT
ELECT BLADE 4.0 EZ CLEAN MEGAD (MISCELLANEOUS) ×3
ELECT BLADE 6.5 EXT (BLADE) IMPLANT
ELECT CAUTERY BLADE 6.4 (BLADE) ×3 IMPLANT
ELECT REM PT RETURN 9FT ADLT (ELECTROSURGICAL) ×3
ELECTRODE BLDE 4.0 EZ CLN MEGD (MISCELLANEOUS) IMPLANT
ELECTRODE REM PT RTRN 9FT ADLT (ELECTROSURGICAL) ×1 IMPLANT
FACESHIELD WRAPAROUND (MASK) ×6 IMPLANT
FACESHIELD WRAPAROUND OR TEAM (MASK) ×2 IMPLANT
GLOVE BIOGEL PI IND STRL 8 (GLOVE) ×2 IMPLANT
GLOVE BIOGEL PI INDICATOR 8 (GLOVE) ×4
GLOVE ECLIPSE 8.0 STRL XLNG CF (GLOVE) ×3 IMPLANT
GLOVE ORTHO TXT STRL SZ7.5 (GLOVE) ×6 IMPLANT
GOWN STRL REUS W/ TWL XL LVL3 (GOWN DISPOSABLE) ×2 IMPLANT
GOWN STRL REUS W/TWL XL LVL3 (GOWN DISPOSABLE) ×6
HANDPIECE INTERPULSE COAX TIP (DISPOSABLE) ×3
KIT BASIN OR (CUSTOM PROCEDURE TRAY) ×3 IMPLANT
KIT ROOM TURNOVER OR (KITS) ×3 IMPLANT
MANIFOLD NEPTUNE II (INSTRUMENTS) ×3 IMPLANT
NS IRRIG 1000ML POUR BTL (IV SOLUTION) ×3 IMPLANT
PACK TOTAL JOINT (CUSTOM PROCEDURE TRAY) ×3 IMPLANT
PACK UNIVERSAL I (CUSTOM PROCEDURE TRAY) ×3 IMPLANT
PAD ARMBOARD 7.5X6 YLW CONV (MISCELLANEOUS) ×6 IMPLANT
RETRACTOR WND ALEXIS 18 MED (MISCELLANEOUS) ×1 IMPLANT
RTRCTR WOUND ALEXIS 18CM MED (MISCELLANEOUS) ×3
SET HNDPC FAN SPRY TIP SCT (DISPOSABLE) ×1 IMPLANT
SPONGE LAP 18X18 X RAY DECT (DISPOSABLE) ×2 IMPLANT
SPONGE LAP 4X18 X RAY DECT (DISPOSABLE) IMPLANT
STRIP CLOSURE SKIN 1/2X4 (GAUZE/BANDAGES/DRESSINGS) ×4 IMPLANT
SUT ETHIBOND NAB CT1 #1 30IN (SUTURE) ×3 IMPLANT
SUT MNCRL AB 4-0 PS2 18 (SUTURE) ×3 IMPLANT
SUT VIC AB 0 CT1 27 (SUTURE) ×3
SUT VIC AB 0 CT1 27XBRD ANBCTR (SUTURE) ×1 IMPLANT
SUT VIC AB 1 CT1 27 (SUTURE) ×3
SUT VIC AB 1 CT1 27XBRD ANBCTR (SUTURE) ×1 IMPLANT
SUT VIC AB 2-0 CT1 27 (SUTURE) ×3
SUT VIC AB 2-0 CT1 TAPERPNT 27 (SUTURE) ×1 IMPLANT
TOWEL OR 17X24 6PK STRL BLUE (TOWEL DISPOSABLE) ×3 IMPLANT
TOWEL OR 17X26 10 PK STRL BLUE (TOWEL DISPOSABLE) ×3 IMPLANT
TRAY FOLEY CATH 16FRSI W/METER (SET/KITS/TRAYS/PACK) IMPLANT
WATER STERILE IRR 1000ML POUR (IV SOLUTION) ×6 IMPLANT

## 2014-06-10 NOTE — Anesthesia Preprocedure Evaluation (Signed)
Anesthesia Evaluation  Patient identified by MRN, date of birth, ID band Patient awake    Reviewed: Allergy & Precautions, H&P , NPO status , Patient's Chart, lab work & pertinent test results  Airway Mallampati: II  TM Distance: >3 FB Neck ROM: Full    Dental  (+) Teeth Intact, Dental Advisory Given   Pulmonary  breath sounds clear to auscultation        Cardiovascular Rhythm:Regular Rate:Normal     Neuro/Psych    GI/Hepatic   Endo/Other    Renal/GU      Musculoskeletal   Abdominal   Peds  Hematology   Anesthesia Other Findings   Reproductive/Obstetrics                             Anesthesia Physical Anesthesia Plan  ASA: II  Anesthesia Plan: MAC and Spinal   Post-op Pain Management:    Induction: Intravenous  Airway Management Planned: Oral ETT  Additional Equipment:   Intra-op Plan:   Post-operative Plan: Extubation in OR  Informed Consent: I have reviewed the patients History and Physical, chart, labs and discussed the procedure including the risks, benefits and alternatives for the proposed anesthesia with the patient or authorized representative who has indicated his/her understanding and acceptance.   Dental advisory given  Plan Discussed with: CRNA and Anesthesiologist  Anesthesia Plan Comments: (RAD lungs clear DJD R. Hip   Plan SAB with MAC  Kipp Broodavid Mishal Probert)        Anesthesia Quick Evaluation

## 2014-06-10 NOTE — Progress Notes (Signed)
Jittery after albuterol neb/ hives along arms/trunk and especially L groin/hip are considerably less pronounced

## 2014-06-10 NOTE — H&P (Signed)
TOTAL HIP ADMISSION H&P  Patient is admitted for right total hip arthroplasty.  Subjective:  Chief Complaint: right hip pain  HPI: Tracy Ross, 48 y.o. female, has a history of pain and functional disability in the right hip(s) due to arthritis and patient has failed non-surgical conservative treatments for greater than 12 weeks to include NSAID's and/or analgesics, corticosteriod injections, flexibility and strengthening excercises and activity modification.  Onset of symptoms was gradual starting 2 years ago with gradually worsening course since that time.The patient noted no past surgery on the right hip(s).  Patient currently rates pain in the right hip at 8 out of 10 with activity. Patient has worsening of pain with activity and weight bearing, pain that interfers with activities of daily living and pain with passive range of motion. Patient has evidence of subchondral cysts, subchondral sclerosis, periarticular osteophytes and joint space narrowing by imaging studies. This condition presents safety issues increasing the risk of falls.  There is no current active infection.  Patient Active Problem List   Diagnosis Date Noted  . Arthritis of right hip 06/10/2014  . Primary osteoarthritis of right hip 03/25/2014  . Retrolisthesis of vertebrae 03/25/2014  . Hip pain, chronic 12/27/2012  . Right Paralabral cyst of hip 06/21/2012  . Right L3/L4 lumbar radiculitis 05/24/2012   Past Medical History  Diagnosis Date  . Asthma   . Complication of anesthesia     Difficult to awake after having wisdom teeth extracted approx 30 years ago  . Family history of adverse reaction to anesthesia     Pt stated "my sisters are very sensitive to anesthesia and it does not take a whole lot to put them to sleep"  . Arthritis     back and hips    Past Surgical History  Procedure Laterality Date  . Tracheostomy      as a child  . Wisdom tooth extraction      No prescriptions prior to admission    Allergies  Allergen Reactions  . Bee Venom Anaphylaxis and Hives    History  Substance Use Topics  . Smoking status: Never Smoker   . Smokeless tobacco: Never Used  . Alcohol Use: Yes     Comment: socially    Family History  Problem Relation Age of Onset  . Diabetes Mother   . Hypertension Mother   . Heart attack Father      Review of Systems  Musculoskeletal: Positive for back pain and joint pain.  All other systems reviewed and are negative.   Objective:  Physical Exam  Constitutional: She is oriented to person, place, and time. She appears well-developed and well-nourished.  HENT:  Head: Atraumatic.  Eyes: EOM are normal. Pupils are equal, round, and reactive to light.  Neck: Normal range of motion. Neck supple.  Cardiovascular: Normal rate and regular rhythm.   Respiratory: Effort normal and breath sounds normal.  GI: Soft. Bowel sounds are normal.  Musculoskeletal:       Right hip: She exhibits decreased range of motion, decreased strength and bony tenderness.  Neurological: She is alert and oriented to person, place, and time.  Skin: Skin is warm and dry.  Psychiatric: She has a normal mood and affect.    Vital signs in last 24 hours:    Labs:   Estimated body mass index is 23.92 kg/(m^2) as calculated from the following:   Height as of 03/25/14: 5\' 3"  (1.6 m).   Weight as of 03/25/14: 61.236 kg (135 lb).  Imaging Review Plain radiographs demonstrate moderate degenerative joint disease of the right hip(s). The bone quality appears to be excellent for age and reported activity level.  Assessment/Plan:  End stage arthritis, right hip(s)  The patient history, physical examination, clinical judgement of the provider and imaging studies are consistent with end stage degenerative joint disease of the right hip(s) and total hip arthroplasty is deemed medically necessary. The treatment options including medical management, injection therapy, arthroscopy and  arthroplasty were discussed at length. The risks and benefits of total hip arthroplasty were presented and reviewed. The risks due to aseptic loosening, infection, stiffness, dislocation/subluxation,  thromboembolic complications and other imponderables were discussed.  The patient acknowledged the explanation, agreed to proceed with the plan and consent was signed. Patient is being admitted for inpatient treatment for surgery, pain control, PT, OT, prophylactic antibiotics, VTE prophylaxis, progressive ambulation and ADL's and discharge planning.The patient is planning to be discharged home with home health services

## 2014-06-10 NOTE — Transfer of Care (Signed)
Immediate Anesthesia Transfer of Care Note  Patient: Tracy Ross  Procedure(s) Performed: Procedure(s): RIGHT TOTAL HIP ARTHROPLASTY ANTERIOR APPROACH (Right)  Patient Location: PACU  Anesthesia Type:MAC and Spinal  Level of Consciousness: awake, alert  and oriented  Airway & Oxygen Therapy: Patient Spontanous Breathing  Post-op Assessment: Report given to PACU RN and Post -op Vital signs reviewed and stable  Post vital signs: Reviewed and stable  Complications: No apparent anesthesia complications

## 2014-06-10 NOTE — Progress Notes (Signed)
Scattered redness arms/trunk/thighs, much less intense than what previously seen, more pale pink than red. Scattered pencil eraser sized hives seen r AC/ L AC, and pt indicates verbally and by actions the itching is less intense. Rarely seen to be scratching and when does so is shortlived and  Almost superficial in nature.

## 2014-06-10 NOTE — Anesthesia Postprocedure Evaluation (Signed)
Anesthesia Post Note  Patient: Tracy Ross  Procedure(s) Performed: Procedure(s) (LRB): RIGHT TOTAL HIP ARTHROPLASTY ANTERIOR APPROACH (Right)  Anesthesia type: general  Patient location: PACU  Post pain: Pain level controlled  Post assessment: Patient's Cardiovascular Status Stable  Last Vitals:  Filed Vitals:   06/10/14 1915  BP:   Pulse: 67  Temp:   Resp: 20    Post vital signs: Reviewed and stable  Level of consciousness: sedated  Complications: No apparent anesthesia complications

## 2014-06-10 NOTE — Brief Op Note (Signed)
06/10/2014  5:23 PM  PATIENT:  Tracy Ross  48 y.o. female  PRE-OPERATIVE DIAGNOSIS:  Right hip osteoarthritis  POST-OPERATIVE DIAGNOSIS:  Right hip osteoarthritis  PROCEDURE:  Procedure(s): RIGHT TOTAL HIP ARTHROPLASTY ANTERIOR APPROACH (Right)  SURGEON:  Surgeon(s) and Role:    * Kathryne Hitchhristopher Y Jamari Moten, MD - Primary  PHYSICIAN ASSISTANT: Rexene EdisonGil Clark, PA-c  ANESTHESIA:   spinal  EBL:  Total I/O In: 1500 [I.V.:1000; IV Piggyback:500] Out: 950 [Urine:150; Blood:800]  BLOOD ADMINISTERED:none  DRAINS: none   LOCAL MEDICATIONS USED:  NONE  SPECIMEN:  No Specimen  DISPOSITION OF SPECIMEN:  N/A  COUNTS:  YES  TOURNIQUET:  * No tourniquets in log *  DICTATION: .Other Dictation: Dictation Number [id number]  PLAN OF CARE: Admit to inpatient   PATIENT DISPOSITION:  PACU - hemodynamically stable.   Delay start of Pharmacological VTE agent (>24hrs) due to surgical blood loss or risk of bleeding: no

## 2014-06-10 NOTE — Progress Notes (Signed)
Slight cough, feels "wheezy" (her words), giving albuyerol neb vs inhaler

## 2014-06-10 NOTE — Progress Notes (Signed)
After 50mg  IV benadryl, continues tobe persistently scratching, fidgeting and is covered front of torso and thighs with diffuse red rash with whelps. Dr Randa EvensEdwards called in re: that benadryl has been only slightly helpful in relieving sxs---> will see/eval she staes

## 2014-06-10 NOTE — Anesthesia Procedure Notes (Signed)
Spinal  Start time: 06/10/2014 3:50 PM End time: 06/10/2014 3:55 PM Staffing Anesthesiologist: Noreene LarssonJOSLIN, Ahlia Lemanski Performed by: anesthesiologist  Preanesthetic Checklist Completed: patient identified, site marked, surgical consent, pre-op evaluation, timeout performed, IV checked, risks and benefits discussed and monitors and equipment checked Spinal Block Patient position: right lateral decubitus Prep: ChloraPrep Patient monitoring: heart rate, cardiac monitor, continuous pulse ox and blood pressure Approach: right paramedian Location: L4-5 Injection technique: single-shot Needle Needle type: Pencil-Tip  Needle gauge: 25 G Needle length: 9 cm Assessment Sensory level: T10 Additional Notes 7.5 mg 0.75% Marcaine with 1:200 Epi injected easily  Tracy Ross

## 2014-06-10 NOTE — Progress Notes (Signed)
Dr. Noreene LarssonJoslin called and informed that pt had about 4 oz of coffee this morning with about 1 tablespoon of milk at 0900, no new orders received.

## 2014-06-11 ENCOUNTER — Encounter (HOSPITAL_COMMUNITY): Payer: Self-pay | Admitting: Orthopaedic Surgery

## 2014-06-11 LAB — BASIC METABOLIC PANEL
Anion gap: 5 (ref 5–15)
BUN: 8 mg/dL (ref 6–23)
CO2: 27 mmol/L (ref 19–32)
CREATININE: 0.78 mg/dL (ref 0.50–1.10)
Calcium: 8.5 mg/dL (ref 8.4–10.5)
Chloride: 105 mEq/L (ref 96–112)
GFR calc non Af Amer: >90 mL/min (ref >90)
Glucose, Bld: 147 mg/dL — ABNORMAL HIGH (ref 70–99)
POTASSIUM: 4.2 mmol/L (ref 3.5–5.1)
Sodium: 137 mmol/L (ref 135–145)

## 2014-06-11 LAB — CBC
HEMATOCRIT: 28.2 % — AB (ref 36.0–46.0)
HEMOGLOBIN: 9.6 g/dL — AB (ref 12.0–15.0)
MCH: 33.4 pg (ref 26.0–34.0)
MCHC: 34 g/dL (ref 30.0–36.0)
MCV: 98.3 fL (ref 78.0–100.0)
Platelets: 281 10*3/uL (ref 150–400)
RBC: 2.87 MIL/uL — ABNORMAL LOW (ref 3.87–5.11)
RDW: 12.7 % (ref 11.5–15.5)
WBC: 7.4 10*3/uL (ref 4.0–10.5)

## 2014-06-11 MED ORDER — METHOCARBAMOL 500 MG PO TABS: 500.0000 mg | ORAL_TABLET | Freq: Four times a day (QID) | ORAL | Status: DC | PRN

## 2014-06-11 MED ORDER — FERROUS SULFATE 325 (65 FE) MG PO TABS: 325.0000 mg | ORAL_TABLET | Freq: Three times a day (TID) | ORAL | Status: DC

## 2014-06-11 MED ORDER — ASPIRIN 325 MG PO TBEC: 325.0000 mg | DELAYED_RELEASE_TABLET | Freq: Every day | ORAL | Status: DC

## 2014-06-11 MED ORDER — OXYCODONE-ACETAMINOPHEN 5-325 MG PO TABS: 1.0000 | ORAL_TABLET | ORAL | Status: DC | PRN

## 2014-06-11 NOTE — Discharge Summary (Signed)
Patient ID: Tracy Ross MRN: [id number] DOB/AGE: 1965/09/02 48 y.o.  Admit date: 06/10/2014 Discharge date: 06/11/2014  Admission Diagnoses:  Principal Problem:   Arthritis of right hip Active Problems:   Status post total replacement of right hip   Discharge Diagnoses:  Same  Past Medical History  Diagnosis Date  . Asthma   . Complication of anesthesia     Difficult to awake after having wisdom teeth extracted approx 30 years ago  . Family history of adverse reaction to anesthesia     Pt stated "my sisters are very sensitive to anesthesia and it does not take a whole lot to put them to sleep"  . Arthritis     back and hips    Surgeries: Procedure(s): RIGHT TOTAL HIP ARTHROPLASTY ANTERIOR APPROACH on 06/10/2014   Consultants:    Discharged Condition: Improved  Hospital Course: Tracy Ross is an 48 y.o. female who was admitted 06/10/2014 for operative treatment ofArthritis of right hip. Patient has severe unremitting pain that affects sleep, daily activities, and work/hobbies. After pre-op clearance the patient was taken to the operating room on 06/10/2014 and underwent  Procedure(s): RIGHT TOTAL HIP ARTHROPLASTY ANTERIOR APPROACH.    Patient was given perioperative antibiotics: Anti-infectives    Start     Dose/Rate Route Frequency Ordered Stop   06/10/14 2200  clindamycin (CLEOCIN) IVPB 900 mg     900 mg100 mL/hr over 30 Minutes Intravenous 3 times per day 06/10/14 1938 06/11/14 0706   06/10/14 0600  ceFAZolin (ANCEF) IVPB 2 g/50 mL premix     2 g100 mL/hr over 30 Minutes Intravenous On call to O.R. 06/09/14 1351 06/10/14 1553       Patient was given sequential compression devices, early ambulation, and chemoprophylaxis to prevent DVT.  Patient benefited maximally from hospital stay and there were no complications.    Recent vital signs: Patient Vitals for the past 24 hrs:  BP Temp Temp src Pulse Resp SpO2 Height Weight  06/11/14 0539 94/62 mmHg  98.4 F (36.9 C) - 91 16 92 % - -  06/11/14 0400 - - - - 16 97 % - -  06/11/14 0200 (!) 107/54 mmHg 97.9 F (36.6 C) - 78 16 98 % - -  06/11/14 0157 - - - - - - 5' 2.99" (1.6 m) 63.801 kg (140 lb 10.5 oz)  06/11/14 0000 - - - - 16 98 % - -  06/10/14 2000 - - - - 17 - - -  06/10/14 1943 99/69 mmHg 98.5 F (36.9 C) - 61 16 100 % - -  06/10/14 1915 119/63 mmHg - - 67 20 99 % - -  06/10/14 1900 - - - (!) 56 15 100 % - -  06/10/14 1845 - - - (!) 58 17 100 % - -  06/10/14 1830 - - - 63 20 100 % - -  06/10/14 1820 - - - - 15 100 % - -  06/10/14 1815 - - - 82 18 100 % - -  06/10/14 1800 - - - 83 16 94 % - -  06/10/14 1745 - - - 89 (!) 31 97 % - -  06/10/14 1739 - 98.1 F (36.7 C) - - 12 98 % - -  06/10/14 1302 (!) 150/81 mmHg 97.1 F (36.2 C) Oral 76 18 100 % - 63.801 kg (140 lb 10.5 oz)     Recent laboratory studies:  Recent Labs  06/11/14 0400  WBC 7.4  HGB 9.6*  HCT 28.2*  PLT 281  NA 137  K 4.2  CL 105  CO2 27  BUN 8  CREATININE 0.78  GLUCOSE 147*  CALCIUM 8.5     Discharge Medications:     Medication List    STOP taking these medications        meloxicam 15 MG tablet  Commonly known as:  MOBIC     predniSONE 10 MG tablet  Commonly known as:  DELTASONE      TAKE these medications        ALBUTEROL SULFATE HFA IN  Inhale 90 mcg into the lungs every 4 (four) hours as needed (2 puffs every 4 hours if needed for shortness of breath).     aspirin 325 MG EC tablet  Take 1 tablet (325 mg total) by mouth daily.     BIOTIN PO  Take 1 tablet by mouth daily.     buPROPion 300 MG 24 hr tablet  Commonly known as:  WELLBUTRIN XL  Take 300 mg by mouth daily.     EPINEPHrine 0.3 mg/0.3 mL Soaj injection  Commonly known as:  EPI-PEN  Inject 0.3 mg into the muscle once.     ferrous sulfate 325 (65 FE) MG tablet  Take 1 tablet (325 mg total) by mouth 3 (three) times daily after meals.     Fish Oil 1000 MG Caps  Take 1 capsule by mouth daily.     methocarbamol  500 MG tablet  Commonly known as:  ROBAXIN  Take 1 tablet (500 mg total) by mouth every 6 (six) hours as needed for muscle spasms.     multivitamin with minerals Tabs tablet  Take 1 tablet by mouth daily.     nitroGLYCERIN 0.2 mg/hr patch  Commonly known as:  NITRODUR - Dosed in mg/24 hr  Apply 1/4 patch to affected area daily.  Change patch every 24 hours.     oxyCODONE-acetaminophen 5-325 MG per tablet  Commonly known as:  ROXICET  Take 1-2 tablets by mouth every 4 (four) hours as needed for severe pain.     traMADol 50 MG tablet  Commonly known as:  ULTRAM  Take 1 tablet (50 mg total) by mouth 2 (two) times daily as needed for pain.        Diagnostic Studies: Dg Hip Operative Right  06/10/2014   CLINICAL DATA:  48 year old female status post right hip replacement.  EXAM: DG OPERATIVE RIGHT HIP  TECHNIQUE: A single spot fluoroscopic AP image of the right hip is submitted.  COMPARISON:  11/18/2013.  FINDINGS: Two intraoperative fluoroscopic spot views of the right hip demonstrate postoperative changes of total hip arthroplasty. The femoral and acetabular components of the prosthesis appear properly seated, without definite periprosthetic fracture or other acute complicating findings. Visualized portions of the bony pelvis are unremarkable.  IMPRESSION: 1. Expected postoperative changes of right total hip arthroplasty, as above.   Electronically Signed   By: Trudie Reedaniel  Entrikin M.D.   On: 06/10/2014 17:36   Dg Pelvis Portable  06/10/2014   CLINICAL DATA:  48 year old female status post right total hip arthroplasty  EXAM: PORTABLE RIGHT HIP - 1 VIEW; PORTABLE PELVIS 1-2 VIEWS  COMPARISON:  Intraoperative radiographs obtained earlier today  FINDINGS: Frontal view of the pelvis with a single cross-table lateral view demonstrates surgical changes of right total hip arthroplasty without evidence of hardware complication. The femoral head component is located on the cross-table lateral view.  Expected subcutaneous emphysema in the soft tissues laterally. Vascular  phleboliths project over the left anatomic pelvis. Unremarkable bowel gas pattern. Normal bony mineralization.  IMPRESSION: Right total hip arthroplasty without evidence of hardware complication.   Electronically Signed   By: Malachy MoanHeath  McCullough M.D.   On: 06/10/2014 19:37   Dg Hip Portable 1 View Right  06/10/2014   CLINICAL DATA:  48 year old female status post right total hip arthroplasty  EXAM: PORTABLE RIGHT HIP - 1 VIEW; PORTABLE PELVIS 1-2 VIEWS  COMPARISON:  Intraoperative radiographs obtained earlier today  FINDINGS: Frontal view of the pelvis with a single cross-table lateral view demonstrates surgical changes of right total hip arthroplasty without evidence of hardware complication. The femoral head component is located on the cross-table lateral view. Expected subcutaneous emphysema in the soft tissues laterally. Vascular phleboliths project over the left anatomic pelvis. Unremarkable bowel gas pattern. Normal bony mineralization.  IMPRESSION: Right total hip arthroplasty without evidence of hardware complication.   Electronically Signed   By: Malachy MoanHeath  McCullough M.D.   On: 06/10/2014 19:37    Disposition: 01-Home or Self Care      Discharge Instructions    Call MD / Call 911    Complete by:  As directed   If you experience chest pain or shortness of breath, CALL 911 and be transported to the hospital emergency room.  If you develope a fever above 101 F, pus (white drainage) or increased drainage or redness at the wound, or calf pain, call your surgeon's office.     Constipation Prevention    Complete by:  As directed   Drink plenty of fluids.  Prune juice may be helpful.  You may use a stool softener, such as Colace (over the counter) 100 mg twice a day.  Use MiraLax (over the counter) for constipation as needed.     Diet - low sodium heart healthy    Complete by:  As directed      Discharge instructions    Complete  by:  As directed   Increase your activities slowly as comfort allows. Expect right thigh swelling; ice as needed. You can get your actual incision wet daily in the shower. Leave your current dressing in place until your outpatient follow-up.     Discharge patient    Complete by:  As directed      Increase activity slowly as tolerated    Complete by:  As directed            Follow-up Information    Follow up with Kathryne HitchBLACKMAN,Daylen Hack Y, MD In 2 weeks.   Specialty:  Orthopedic Surgery   Contact information:   [address] [place] [address] [phone]        Signed: Kathryne HitchBLACKMAN,Shylie Polo Y 06/11/2014, 7:37 AM

## 2014-06-11 NOTE — Progress Notes (Signed)
Utilization review completed.  

## 2014-06-11 NOTE — Progress Notes (Signed)
CARE MANAGEMENT NOTE 06/11/2014  Patient:  Tracy Ross,Tracy Ross   Account Number:  [id number]  Date Initiated:  06/11/2014  Documentation initiated by:  [facility] Center[facility]RIEG,Prezley Qadir  Subjective/Objective Assessment:   s/p rt THA     Action/Plan:   PT eval- recommended HHPT   Anticipated DC Date:  06/11/2014   Anticipated DC Plan:  HOME W HOME HEALTH SERVICES      DC Planning Services  CM consult      [facility]  DURABLE MEDICAL EQUIPMENT  HOME HEALTH   Choice offered to / List presented to:  C-1 Patient   DME arranged  Levan HurstWALKER - ROLLING      DME agency  [facility]     HH arranged  [facility]      [facility]   Status of service:  Completed, signed off Medicare Important Message given?   (If response is "NO", the following Medicare IM given date fields will be blank) Date Medicare IM given:   Medicare IM given by:   Date Additional Medicare IM given:   Additional Medicare IM given by:    Discharge Disposition:  HOME W HOME HEALTH SERVICES  Per UR Regulation:  Reviewed for med. necessity/level of care/duration of stay  If discussed at Long Length of Stay Meetings, dates discussed:    Comments:  06/11/14 [facility] by MD office. Spoke with patient and husband, no change in d/c plan. Needs rolling walker, contacted Jermaine with [facility] and requested rolling walker be delivered to room. No other d/c needs identified. Jacquelynn CreeMary Isola Mehlman RN, BSN, CCM

## 2014-06-11 NOTE — Evaluation (Signed)
Physical Therapy Evaluation Patient Details Name: Tracy Ross MRN: [id number] DOB: 06/08/1966 Today's Date: 06/11/2014   History of Present Illness  Pt admitted 12/22 for R direct anterior approach THA due to DJD.   Clinical Impression  Pt is s/p direct anterior R THA resulting in the deficits listed below (see PT Problem List). Pt tolerated OOB mobility well despite nausea. Pt impulsive and desired to increase activity sooner than advised however instructed pt on proper transfer techniques and strongly encouraged pt to take their time. Pt with great home set up and support.  Pt will benefit from skilled PT to increase their independence and safety with mobility to allow discharge to the venue listed below.      Follow Up Recommendations Home health PT;Supervision - Intermittent    Equipment Recommendations  Rolling walker with 5" wheels    Recommendations for Other Services       Precautions / Restrictions Precautions Precautions: Fall (due to low BP) Restrictions Weight Bearing Restrictions: Yes RLE Weight Bearing: Weight bearing as tolerated      Mobility  Bed Mobility Overal bed mobility: Needs Assistance Bed Mobility: Supine to Sit     Supine to sit: Supervision     General bed mobility comments: V/c's for long sit technique and to maintain R quad set during add/abd of R LE to minimalize pain  Transfers Overall transfer level: Needs assistance Equipment used: Rolling walker (2 wheeled) Transfers: Sit to/from Stand Sit to Stand: Min guard         General transfer comment: v/c's for safe hand placement  Ambulation/Gait Ambulation/Gait assistance: Min guard Ambulation Distance (Feet): 100 Feet Assistive device: Rolling walker (2 wheeled) Gait Pattern/deviations: Step-through pattern;Decreased stride length;Antalgic (increased bilat UE WBing) Gait velocity: slow Gait velocity interpretation: Below normal speed for age/gender General Gait Details:  v/c's to increase R LE WBing, v/c's to slow down  Stairs Stairs: Yes Stairs assistance: Min assist Stair Management: One rail Left (HHA on the R) Number of Stairs: 4 General stair comments: also practiced threshold step, pt with good recall and return demo of "up with the good, down with the bad"  Wheelchair Mobility    Modified Rankin (Stroke Patients Only)       Balance Overall balance assessment: Needs assistance         Standing balance support: Bilateral upper extremity supported   Standing balance comment: pt requires use of RW for safe amb at this time                             Pertinent Vitals/Pain Pain Assessment: 0-10 Pain Score: 5  Pain Location: R hip at surgical site Pain Intervention(s): Monitored during session    Home Living Family/patient expects to be discharged to:: Private residence Living Arrangements: Spouse/significant other;Children Available Help at Discharge: Family;Available 24 hours/day Type of Home: House Home Access: Level entry     Home Layout: Multi-level;Bed/bath upstairs Home Equipment: None      Prior Function Level of Independence: Independent               Hand Dominance   Dominant Hand: Right    Extremity/Trunk Assessment   Upper Extremity Assessment: Overall WFL for tasks assessed           Lower Extremity Assessment: RLE deficits/detail RLE Deficits / Details: hip flexion limited by pain, able to complete quad set    Cervical / Trunk Assessment: Normal  Communication  Communication: No difficulties  Cognition Arousal/Alertness: Awake/alert Behavior During Therapy: Impulsive Overall Cognitive Status: Within Functional Limits for tasks assessed                      General Comments General comments (skin integrity, edema, etc.): pt strongly encouraged to slow down due to impulsive behavior and potential falls risk.     Exercises Total Joint Exercises Ankle Circles/Pumps:  AROM;Both;10 reps;Seated Quad Sets: AROM;Right;10 reps;Seated (with LEs elevated)      Assessment/Plan    PT Assessment Patient needs continued PT services  PT Diagnosis Difficulty walking;Acute pain   PT Problem List Decreased strength;Decreased activity tolerance;Decreased range of motion;Decreased balance;Decreased mobility  PT Treatment Interventions DME instruction;Gait training;Stair training;Functional mobility training;Therapeutic activities;Therapeutic exercise   PT Goals (Current goals can be found in the Care Plan section) Acute Rehab PT Goals Patient Stated Goal: home PT Goal Formulation: With patient Time For Goal Achievement: 06/18/14 Potential to Achieve Goals: Good    Frequency 7X/week   Barriers to discharge        Co-evaluation               End of Session Equipment Utilized During Treatment: Gait belt Activity Tolerance: Patient tolerated treatment well Patient left: in chair;with call bell/phone within reach;with nursing/sitter in room Nurse Communication: Mobility status         Time: 0728-0811 PT Time Calculation (min) (ACUTE ONLY): 43 min   Charges:   PT Evaluation $Initial PT Evaluation Tier I: 1 Procedure PT Treatments $Gait Training: 8-22 mins $Therapeutic Exercise: 8-22 mins   PT G CodesMarcene Brawn:          Tracy Ross 06/11/2014, 8:28 AM   Lewis ShockAshly Minor Ross, PT, DPT Pager #: [phone] Office #: [phone]

## 2014-06-11 NOTE — Progress Notes (Signed)
Subjective: 1 Day Post-Op Procedure(s) (LRB): RIGHT TOTAL HIP ARTHROPLASTY ANTERIOR APPROACH (Right) Patient reports pain as moderate.  Asymptomatic acute blood loss anemia.  Objective: Vital signs in last 24 hours: Temp:  [97.1 F (36.2 C)-98.5 F (36.9 C)] 98.4 F (36.9 C) (12/23 0539) Pulse Rate:  [56-91] 91 (12/23 0539) Resp:  [12-31] 16 (12/23 0539) BP: (94-150)/(54-81) 94/62 mmHg (12/23 0539) SpO2:  [92 %-100 %] 92 % (12/23 0539) Weight:  [63.801 kg (140 lb 10.5 oz)] 63.801 kg (140 lb 10.5 oz) (12/23 0157)  Intake/Output from previous day: 12/22 0701 - 12/23 0700 In: 2300 [P.O.:400; I.V.:1400; IV Piggyback:500] Out: 1450 [Urine:650; Blood:800] Intake/Output this shift: Total I/O In: 1051.3 [P.O.:120; I.V.:831.3; IV Piggyback:100] Out: -    Recent Labs  06/11/14 0400  HGB 9.6*    Recent Labs  06/11/14 0400  WBC 7.4  RBC 2.87*  HCT 28.2*  PLT 281    Recent Labs  06/11/14 0400  NA 137  K 4.2  CL 105  CO2 27  BUN 8  CREATININE 0.78  GLUCOSE 147*  CALCIUM 8.5   No results for input(s): LABPT, INR in the last 72 hours.  Sensation intact distally Intact pulses distally Dorsiflexion/Plantar flexion intact Incision: scant drainage  Assessment/Plan: 1 Day Post-Op Procedure(s) (LRB): RIGHT TOTAL HIP ARTHROPLASTY ANTERIOR APPROACH (Right) Up with therapy  Tracy Ross Y 06/11/2014, 7:20 AM

## 2014-06-11 NOTE — Op Note (Signed)
NAMDevota Pace:  Ross, Tracy          ACCOUNT NO.:  [id number]  MEDICAL RECORD NO.:  [id number]  LOCATION:  [id number]                        FACILITY:  [facility]  PHYSICIAN:  Vanita PandaChristopher Y. Magnus IvanBlackman, M.D.DATE OF BIRTH:  08-22-1965  DATE OF PROCEDURE:  06/10/2014 DATE OF DISCHARGE:                              OPERATIVE REPORT   PREOPERATIVE DIAGNOSIS:  Moderate primary osteoarthritis, right hip.  PREOPERATIVE DIAGNOSIS:  Severe primary osteoarthritis, right hip.  PROCEDURE:  Right total hip arthroplasty through direct anterior approach.  IMPLANTS:  DePuy Sector Gription acetabular component size 50 with apex hole eliminator and a single screw, size 32+ 4 neutral polyethylene liner for this acetabular component, size 8 Corail femoral component with standard offset, size 32+ 1 ceramic hip ball.  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  SURGEON:  Vanita PandaChristopher Y. Magnus IvanBlackman, M.D.  ASSISTANT:  Richardean CanalGilbert Clark, PA-C.  ANESTHESIA:  Spinal.  ANTIBIOTICS:  2 g of IV Ancef.  BLOOD LOSS:  800 mL.  COMPLICATIONS:  None.  INDICATIONS:  Tracy Ross is a very active and energetic 48 year old female who is quite petite.  She has been athletic all of her life and had developed hip pain over the last year to 2 years.  She is a patient Dr. Gwenyth BouillonBrad Fields, primary care at [facility], who had even seen her for an MRI of her hip and all the plain films did show some superolateral acetabular wear.  She did have some un-coverage and I felt an MRI was definitely warranted to better assess her hip.  She tried intra-articular injection and conservative treatment.  Again, she is a very active individual.  She has tried activity modification to some degree, but she is so energetic that it is hard to keep her down which understandably so.  Now, her quality of life has been affected.  Her pain is quite severe on internal and  external rotation with weightbearing with failure of modalities of conservative treatment.  She did wish to proceed with surgical intervention.  An arthroscopic intervention would not be warranted based on the cartilage changes that we saw on the MRI.  Her pain is getting worse.  We recommended a direct anterior hip replacement surgery and she did wish to have this done. She understands the risk of acute blood loss anemia, nerve and vessel injury, fracture, infection, dislocation, DVT.  She understands the goals for decreased pain, improved mobility, and hopefully will improve quality of life.  PROCEDURE DESCRIPTION:  After informed consent was obtained, appropriate right hip was marked.  She was brought to the operating room and spinal anesthesia was obtained while she was on a stretcher.  A Foley catheter was placed and both feet had traction boots applied to them.  Next, she was placed supine on the Hana fracture table with a perineal post in place and both legs in direct in-line skeletal traction.  Of note, we measured her leg lengths preoperatively be direct equal.  With her both legs in inline skeletal devices, no traction applied.  We first assessed her hips and pelvis radiographically and again felt her leg lengths were equal.  We then prepped and draped the right  hip with DuraPrep and sterile drapes including sterile drapes on the fluoroscopic unit.  A time-out was called.  She was identified as correct patient and correct right hip.  I then made an incision inferior and posterior to the anterior superior iliac spine and carried this obliquely down the leg. I dissected down the tensor fascia lata muscle and the tensor fascia was divided longitudinally.  So, I proceeded with a direct anterior approach to the hip.  We identified and cauterized the lateral femoral circumflex vessels.  I was able to place a Cobra retractor around the lateral neck and up underneath the rectus femoris  around the medial neck.  I then opened up the hip capsule in an L-type format and did find a moderate effusion.  We did significant wear on the humeral head.  I then made my femoral neck cut with an oscillating saw proximal to the lesser trochanter and completed this with an osteotome.  I placed a corkscrew guide in the femoral head and removed the femoral head in its entirety and found the large section devoid of cartilage and certainly evidence of dysplasia.  I cleaned the acetabulum debris and including remnants of the acetabular labrum, which was degenerative.  I then placed a bent Hohmann medially and was able to then began reaming under direct visualization from a size 48 up to the size 50.  The size 50 reamer was also placed under direct fluoroscopy.  So, I could obtain our depth of reaming, our inclination, and anteversion.  Once I was pleased with this, I placed the real DePuy sector Gription acetabular component of size 50, and a single screw as well as the apex hole guide and a 32+ 4 neutral polyethylene liner for size 50 acetabular component.  Attention was then turned to the femur.  With the leg externally rotated to 100 degrees extended and adducted, we were able to place a Mueller retractor medially and Hohmann retractor behind the greater trochanter.  I released the lateral joint capsule and used a box cutting osteotome to open up the femoral canal.  I used a rongeur to lateralize and then passed a size 8 broach.  She is a petite and individually, the size 8 was rather tight, so I trialed a standard neck and a 32+ 1 hip ball.  We brought the leg back over and up, and her leg lengths and offsets were measured equal after reduced her femoral head into the pelvis.  She had excellent range of motion with stability as well that I felt comfortable with continue to proceed.  We then dislocated the hip and removed the trial components.  I placed the real Corail femoral component  size 8 and the real 32+ 1 ceramic hip ball.  We then irrigated the soft tissue with normal saline solution using pulsatile lavage after reducing the hip. We closed the joint capsule with interrupted #1 Ethibond suture followed by running #1 Vicryl in the tensor fascia, 0 Vicryl in the deep tissue, 2-0 Vicryl in the subcutaneous tissue, 4-0 Monocryl subcuticular stitch and Steri-Strips on the skin.  An Aquacel dressing was applied.  She was then taken off the fracture table and taken to the recovery room in stable condition.  All final counts were correct.  There were no complications noted.     Vanita Pandahristopher Y. Magnus IvanBlackman, M.D.     CYB/MEDQ  D:  06/10/2014  T:  06/11/2014  Job:  [id number]

## 2015-07-31 ENCOUNTER — Other Ambulatory Visit: Payer: Self-pay | Admitting: Neurosurgery

## 2015-07-31 DIAGNOSIS — M419 Scoliosis, unspecified: Secondary | ICD-10-CM

## 2015-08-09 ENCOUNTER — Ambulatory Visit
Admission: RE | Admit: 2015-08-09 | Discharge: 2015-08-09 | Disposition: A | Discharge status: Home / Self Care | Payer: 59 | Source: Ambulatory Visit | Attending: Neurosurgery | Admitting: Neurosurgery

## 2015-08-09 DIAGNOSIS — M419 Scoliosis, unspecified: Secondary | ICD-10-CM

## 2016-01-18 IMAGING — CR DG HIP 1V PORT*R*
1 series · 1 of 1 positions shown · non-contrast
Comparison: Intraoperative radiographs obtained earlier today

CLINICAL DATA: 48-year-old female status post right total hip
arthroplasty

EXAM:
PORTABLE RIGHT HIP - 1 VIEW; PORTABLE PELVIS 1-2 VIEWS

[AP]
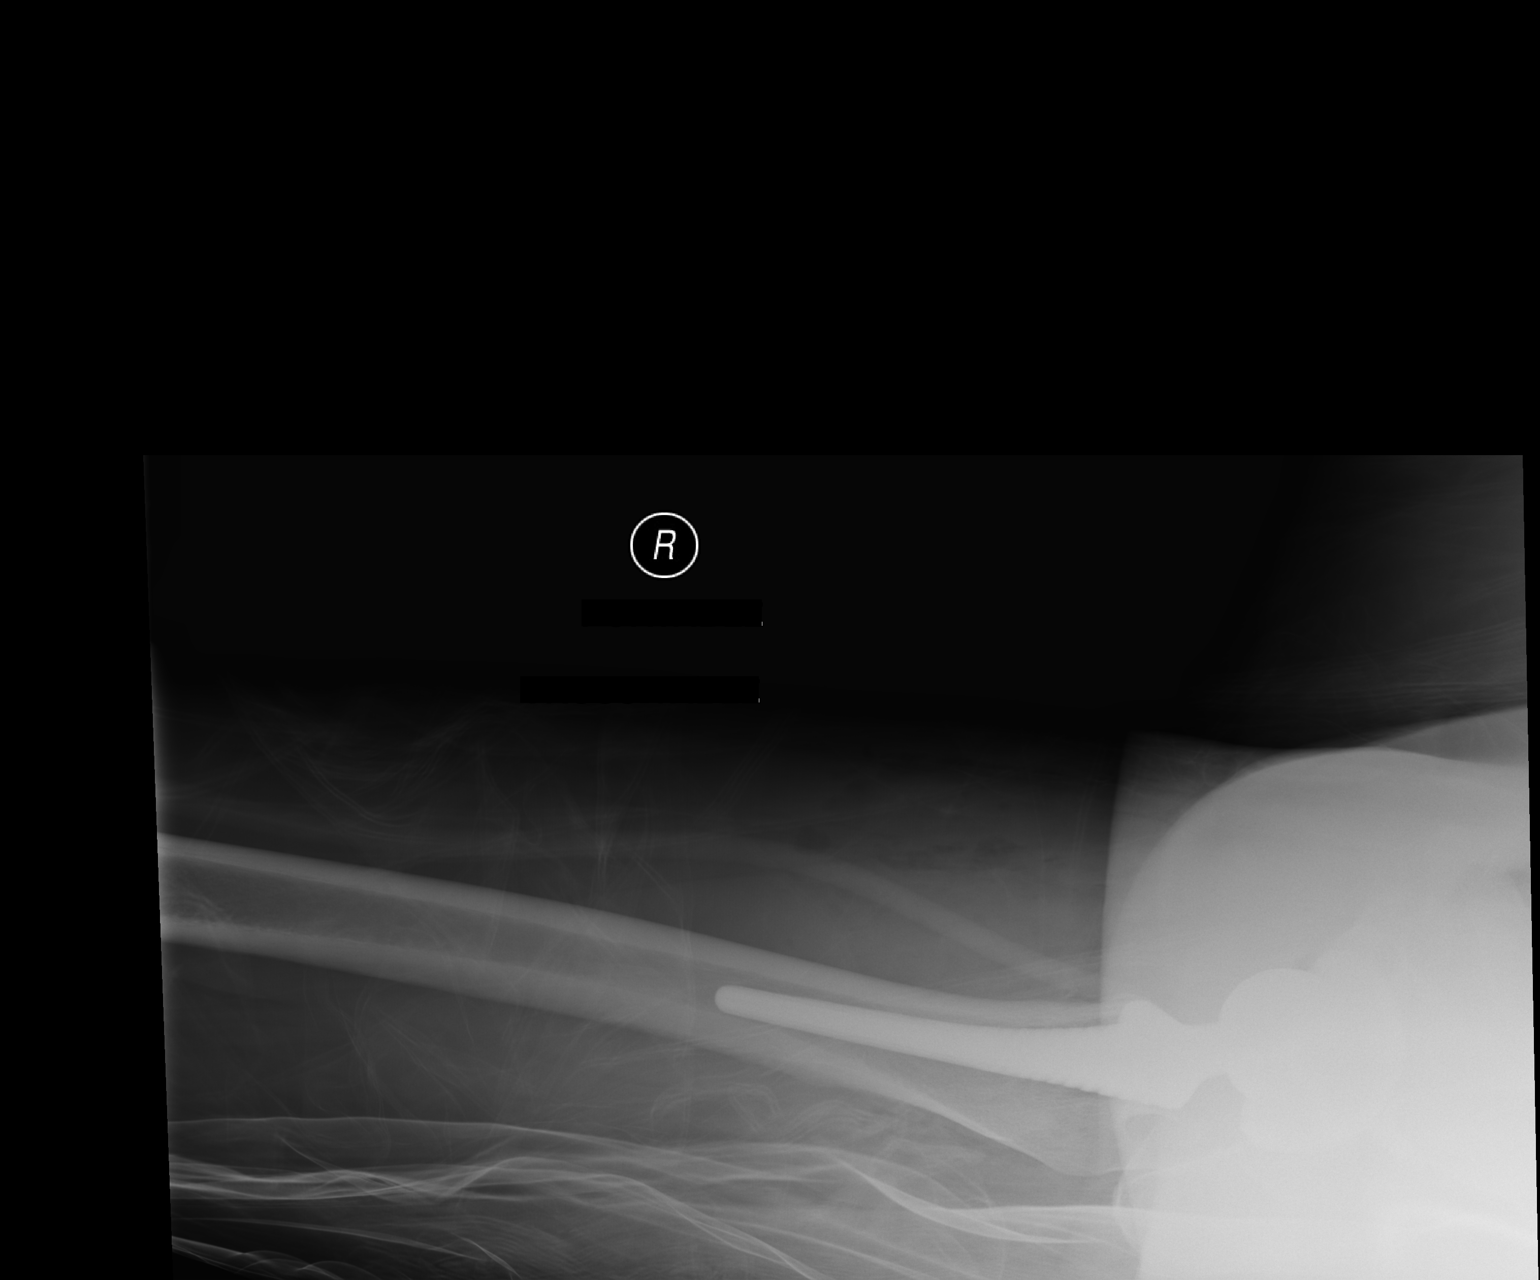

[1 of 1 positions shown; findings below may reference images not displayed]

FINDINGS: Frontal view of the pelvis with a single cross-table lateral view
demonstrates surgical changes of right total hip arthroplasty
without evidence of hardware complication. The femoral head
component is located on the cross-table lateral view. Expected
subcutaneous emphysema in the soft tissues laterally. Vascular
phleboliths project over the left anatomic pelvis. Unremarkable
bowel gas pattern. Normal bony mineralization.
IMPRESSION: Right total hip arthroplasty without evidence of hardware
complication.

## 2016-01-18 IMAGING — RF DG HIP OPERATIVE*R*
1 series · 2 of 2 positions shown · non-contrast
Comparison: 11/18/2013.

CLINICAL DATA: 48-year-old female status post right hip
replacement.

EXAM:
DG OPERATIVE RIGHT HIP
TECHNIQUE: A single spot fluoroscopic AP image of the right hip is submitted.

[Series 1: run · 2 of 2 slices shown]
[im 1/2]
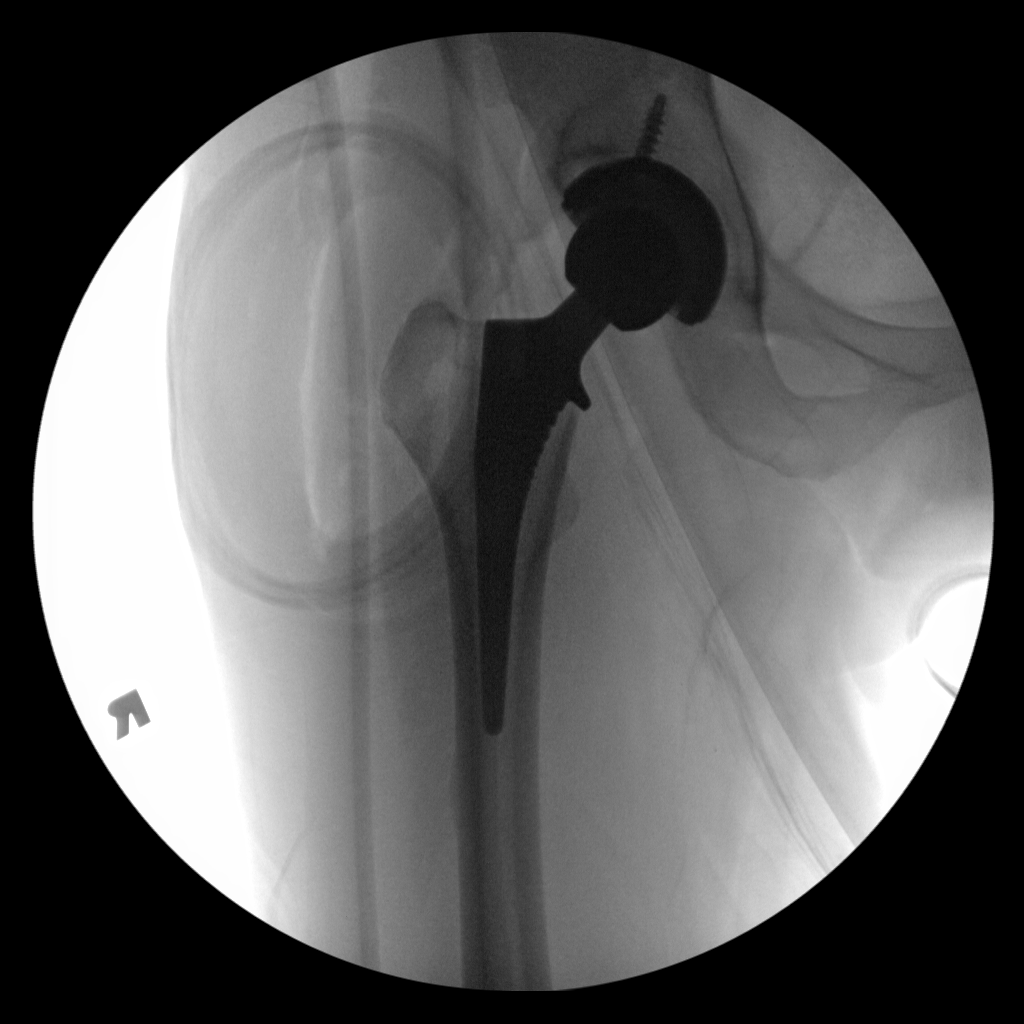
[im 2/2]
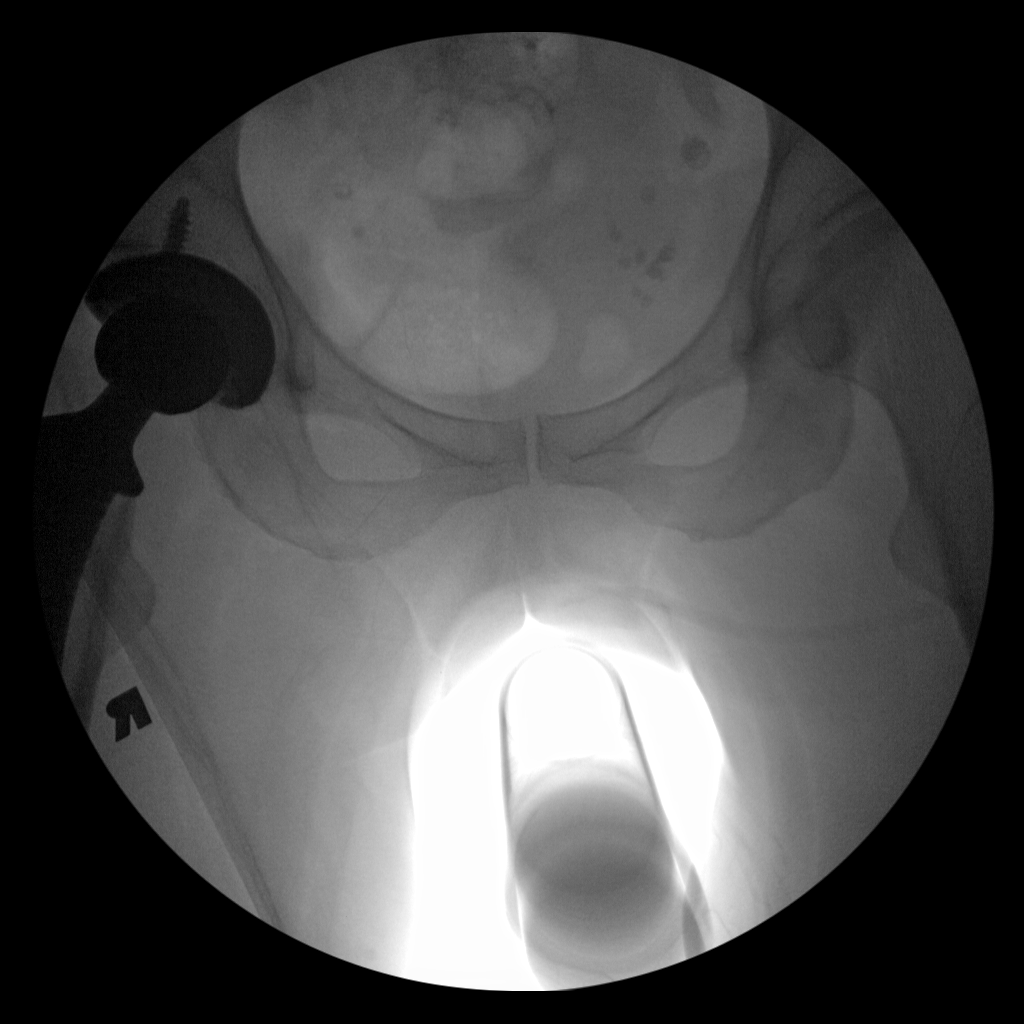

[2 of 2 positions shown; findings below may reference images not displayed]

FINDINGS: Two intraoperative fluoroscopic spot views of the right hip
demonstrate postoperative changes of total hip arthroplasty. The
femoral and acetabular components of the prosthesis appear properly
seated, without definite periprosthetic fracture or other acute
complicating findings. Visualized portions of the bony pelvis are
unremarkable.
IMPRESSION: 1. Expected postoperative changes of right total hip arthroplasty,
as above.

## 2016-02-27 ENCOUNTER — Other Ambulatory Visit: Payer: Self-pay | Admitting: Family Medicine

## 2016-02-27 DIAGNOSIS — M542 Cervicalgia: Secondary | ICD-10-CM

## 2016-03-05 ENCOUNTER — Ambulatory Visit
Admission: RE | Admit: 2016-03-05 | Discharge: 2016-03-05 | Disposition: A | Discharge status: Home / Self Care | Payer: 59 | Source: Ambulatory Visit | Attending: Family Medicine | Admitting: Family Medicine

## 2016-03-05 DIAGNOSIS — M542 Cervicalgia: Secondary | ICD-10-CM

## 2016-06-22 DIAGNOSIS — M542 Cervicalgia: Secondary | ICD-10-CM | POA: Diagnosis not present

## 2016-06-22 DIAGNOSIS — M546 Pain in thoracic spine: Secondary | ICD-10-CM | POA: Diagnosis not present

## 2016-06-24 DIAGNOSIS — J3081 Allergic rhinitis due to animal (cat) (dog) hair and dander: Secondary | ICD-10-CM | POA: Diagnosis not present

## 2016-06-24 DIAGNOSIS — J301 Allergic rhinitis due to pollen: Secondary | ICD-10-CM | POA: Diagnosis not present

## 2016-06-24 DIAGNOSIS — J3089 Other allergic rhinitis: Secondary | ICD-10-CM | POA: Diagnosis not present

## 2016-06-29 DIAGNOSIS — M542 Cervicalgia: Secondary | ICD-10-CM | POA: Diagnosis not present

## 2016-06-29 DIAGNOSIS — M546 Pain in thoracic spine: Secondary | ICD-10-CM | POA: Diagnosis not present

## 2016-07-12 DIAGNOSIS — J3081 Allergic rhinitis due to animal (cat) (dog) hair and dander: Secondary | ICD-10-CM | POA: Diagnosis not present

## 2016-07-12 DIAGNOSIS — J301 Allergic rhinitis due to pollen: Secondary | ICD-10-CM | POA: Diagnosis not present

## 2016-07-12 DIAGNOSIS — J3089 Other allergic rhinitis: Secondary | ICD-10-CM | POA: Diagnosis not present

## 2016-07-19 DIAGNOSIS — M47816 Spondylosis without myelopathy or radiculopathy, lumbar region: Secondary | ICD-10-CM | POA: Diagnosis not present

## 2016-08-04 DIAGNOSIS — J3089 Other allergic rhinitis: Secondary | ICD-10-CM | POA: Diagnosis not present

## 2016-08-04 DIAGNOSIS — J3081 Allergic rhinitis due to animal (cat) (dog) hair and dander: Secondary | ICD-10-CM | POA: Diagnosis not present

## 2016-08-04 DIAGNOSIS — J301 Allergic rhinitis due to pollen: Secondary | ICD-10-CM | POA: Diagnosis not present

## 2016-08-08 DIAGNOSIS — J301 Allergic rhinitis due to pollen: Secondary | ICD-10-CM | POA: Diagnosis not present

## 2016-08-08 DIAGNOSIS — J3089 Other allergic rhinitis: Secondary | ICD-10-CM | POA: Diagnosis not present

## 2016-08-08 DIAGNOSIS — K219 Gastro-esophageal reflux disease without esophagitis: Secondary | ICD-10-CM | POA: Diagnosis not present

## 2016-08-08 DIAGNOSIS — J3081 Allergic rhinitis due to animal (cat) (dog) hair and dander: Secondary | ICD-10-CM | POA: Diagnosis not present

## 2016-08-11 DIAGNOSIS — J3081 Allergic rhinitis due to animal (cat) (dog) hair and dander: Secondary | ICD-10-CM | POA: Diagnosis not present

## 2016-08-11 DIAGNOSIS — J301 Allergic rhinitis due to pollen: Secondary | ICD-10-CM | POA: Diagnosis not present

## 2016-08-12 DIAGNOSIS — J3089 Other allergic rhinitis: Secondary | ICD-10-CM | POA: Diagnosis not present

## 2016-08-19 DIAGNOSIS — J3081 Allergic rhinitis due to animal (cat) (dog) hair and dander: Secondary | ICD-10-CM | POA: Diagnosis not present

## 2016-08-19 DIAGNOSIS — J3089 Other allergic rhinitis: Secondary | ICD-10-CM | POA: Diagnosis not present

## 2016-08-19 DIAGNOSIS — J301 Allergic rhinitis due to pollen: Secondary | ICD-10-CM | POA: Diagnosis not present

## 2016-08-23 DIAGNOSIS — J301 Allergic rhinitis due to pollen: Secondary | ICD-10-CM | POA: Diagnosis not present

## 2016-08-23 DIAGNOSIS — J3081 Allergic rhinitis due to animal (cat) (dog) hair and dander: Secondary | ICD-10-CM | POA: Diagnosis not present

## 2016-08-23 DIAGNOSIS — J3089 Other allergic rhinitis: Secondary | ICD-10-CM | POA: Diagnosis not present

## 2016-08-24 DIAGNOSIS — E349 Endocrine disorder, unspecified: Secondary | ICD-10-CM | POA: Diagnosis not present

## 2016-08-26 DIAGNOSIS — J3089 Other allergic rhinitis: Secondary | ICD-10-CM | POA: Diagnosis not present

## 2016-08-26 DIAGNOSIS — J301 Allergic rhinitis due to pollen: Secondary | ICD-10-CM | POA: Diagnosis not present

## 2016-08-26 DIAGNOSIS — J4520 Mild intermittent asthma, uncomplicated: Secondary | ICD-10-CM | POA: Diagnosis not present

## 2016-08-26 DIAGNOSIS — J3081 Allergic rhinitis due to animal (cat) (dog) hair and dander: Secondary | ICD-10-CM | POA: Diagnosis not present

## 2016-08-30 DIAGNOSIS — J3081 Allergic rhinitis due to animal (cat) (dog) hair and dander: Secondary | ICD-10-CM | POA: Diagnosis not present

## 2016-08-30 DIAGNOSIS — J3089 Other allergic rhinitis: Secondary | ICD-10-CM | POA: Diagnosis not present

## 2016-08-30 DIAGNOSIS — J301 Allergic rhinitis due to pollen: Secondary | ICD-10-CM | POA: Diagnosis not present

## 2016-09-09 DIAGNOSIS — J3081 Allergic rhinitis due to animal (cat) (dog) hair and dander: Secondary | ICD-10-CM | POA: Diagnosis not present

## 2016-09-09 DIAGNOSIS — J3089 Other allergic rhinitis: Secondary | ICD-10-CM | POA: Diagnosis not present

## 2016-09-09 DIAGNOSIS — J301 Allergic rhinitis due to pollen: Secondary | ICD-10-CM | POA: Diagnosis not present

## 2016-09-14 DIAGNOSIS — J3089 Other allergic rhinitis: Secondary | ICD-10-CM | POA: Diagnosis not present

## 2016-09-14 DIAGNOSIS — J301 Allergic rhinitis due to pollen: Secondary | ICD-10-CM | POA: Diagnosis not present

## 2016-09-14 DIAGNOSIS — J3081 Allergic rhinitis due to animal (cat) (dog) hair and dander: Secondary | ICD-10-CM | POA: Diagnosis not present

## 2016-09-29 DIAGNOSIS — J3081 Allergic rhinitis due to animal (cat) (dog) hair and dander: Secondary | ICD-10-CM | POA: Diagnosis not present

## 2016-09-29 DIAGNOSIS — J301 Allergic rhinitis due to pollen: Secondary | ICD-10-CM | POA: Diagnosis not present

## 2016-09-29 DIAGNOSIS — J3089 Other allergic rhinitis: Secondary | ICD-10-CM | POA: Diagnosis not present

## 2016-10-13 DIAGNOSIS — J301 Allergic rhinitis due to pollen: Secondary | ICD-10-CM | POA: Diagnosis not present

## 2016-10-13 DIAGNOSIS — J387 Other diseases of larynx: Secondary | ICD-10-CM | POA: Diagnosis not present

## 2016-10-13 DIAGNOSIS — J3089 Other allergic rhinitis: Secondary | ICD-10-CM | POA: Diagnosis not present

## 2016-10-13 DIAGNOSIS — J3081 Allergic rhinitis due to animal (cat) (dog) hair and dander: Secondary | ICD-10-CM | POA: Diagnosis not present

## 2016-10-13 DIAGNOSIS — G479 Sleep disorder, unspecified: Secondary | ICD-10-CM | POA: Diagnosis not present

## 2016-10-26 DIAGNOSIS — J301 Allergic rhinitis due to pollen: Secondary | ICD-10-CM | POA: Diagnosis not present

## 2016-10-26 DIAGNOSIS — J3089 Other allergic rhinitis: Secondary | ICD-10-CM | POA: Diagnosis not present

## 2016-10-26 DIAGNOSIS — E039 Hypothyroidism, unspecified: Secondary | ICD-10-CM | POA: Diagnosis not present

## 2016-10-26 DIAGNOSIS — E538 Deficiency of other specified B group vitamins: Secondary | ICD-10-CM | POA: Diagnosis not present

## 2016-10-26 DIAGNOSIS — J3081 Allergic rhinitis due to animal (cat) (dog) hair and dander: Secondary | ICD-10-CM | POA: Diagnosis not present

## 2016-10-26 DIAGNOSIS — N951 Menopausal and female climacteric states: Secondary | ICD-10-CM | POA: Diagnosis not present

## 2016-10-31 DIAGNOSIS — J3081 Allergic rhinitis due to animal (cat) (dog) hair and dander: Secondary | ICD-10-CM | POA: Diagnosis not present

## 2016-10-31 DIAGNOSIS — J3089 Other allergic rhinitis: Secondary | ICD-10-CM | POA: Diagnosis not present

## 2016-10-31 DIAGNOSIS — J301 Allergic rhinitis due to pollen: Secondary | ICD-10-CM | POA: Diagnosis not present

## 2016-11-02 DIAGNOSIS — K219 Gastro-esophageal reflux disease without esophagitis: Secondary | ICD-10-CM | POA: Diagnosis not present

## 2016-11-02 DIAGNOSIS — J301 Allergic rhinitis due to pollen: Secondary | ICD-10-CM | POA: Diagnosis not present

## 2016-11-02 DIAGNOSIS — J3089 Other allergic rhinitis: Secondary | ICD-10-CM | POA: Diagnosis not present

## 2016-11-02 DIAGNOSIS — J3081 Allergic rhinitis due to animal (cat) (dog) hair and dander: Secondary | ICD-10-CM | POA: Diagnosis not present

## 2016-11-07 DIAGNOSIS — J3081 Allergic rhinitis due to animal (cat) (dog) hair and dander: Secondary | ICD-10-CM | POA: Diagnosis not present

## 2016-11-07 DIAGNOSIS — J301 Allergic rhinitis due to pollen: Secondary | ICD-10-CM | POA: Diagnosis not present

## 2016-11-07 DIAGNOSIS — J3089 Other allergic rhinitis: Secondary | ICD-10-CM | POA: Diagnosis not present

## 2016-11-09 DIAGNOSIS — J3081 Allergic rhinitis due to animal (cat) (dog) hair and dander: Secondary | ICD-10-CM | POA: Diagnosis not present

## 2016-11-09 DIAGNOSIS — J301 Allergic rhinitis due to pollen: Secondary | ICD-10-CM | POA: Diagnosis not present

## 2016-11-09 DIAGNOSIS — J3089 Other allergic rhinitis: Secondary | ICD-10-CM | POA: Diagnosis not present

## 2016-11-16 DIAGNOSIS — J3089 Other allergic rhinitis: Secondary | ICD-10-CM | POA: Diagnosis not present

## 2016-11-16 DIAGNOSIS — J301 Allergic rhinitis due to pollen: Secondary | ICD-10-CM | POA: Diagnosis not present

## 2016-11-16 DIAGNOSIS — J3081 Allergic rhinitis due to animal (cat) (dog) hair and dander: Secondary | ICD-10-CM | POA: Diagnosis not present

## 2016-11-24 DIAGNOSIS — J3081 Allergic rhinitis due to animal (cat) (dog) hair and dander: Secondary | ICD-10-CM | POA: Diagnosis not present

## 2016-11-24 DIAGNOSIS — J3089 Other allergic rhinitis: Secondary | ICD-10-CM | POA: Diagnosis not present

## 2016-11-24 DIAGNOSIS — J301 Allergic rhinitis due to pollen: Secondary | ICD-10-CM | POA: Diagnosis not present

## 2016-12-19 DIAGNOSIS — J3081 Allergic rhinitis due to animal (cat) (dog) hair and dander: Secondary | ICD-10-CM | POA: Diagnosis not present

## 2016-12-19 DIAGNOSIS — J301 Allergic rhinitis due to pollen: Secondary | ICD-10-CM | POA: Diagnosis not present

## 2016-12-19 DIAGNOSIS — J3089 Other allergic rhinitis: Secondary | ICD-10-CM | POA: Diagnosis not present

## 2016-12-23 DIAGNOSIS — J3089 Other allergic rhinitis: Secondary | ICD-10-CM | POA: Diagnosis not present

## 2016-12-23 DIAGNOSIS — J3081 Allergic rhinitis due to animal (cat) (dog) hair and dander: Secondary | ICD-10-CM | POA: Diagnosis not present

## 2016-12-23 DIAGNOSIS — J301 Allergic rhinitis due to pollen: Secondary | ICD-10-CM | POA: Diagnosis not present

## 2016-12-27 DIAGNOSIS — J3089 Other allergic rhinitis: Secondary | ICD-10-CM | POA: Diagnosis not present

## 2016-12-27 DIAGNOSIS — J3081 Allergic rhinitis due to animal (cat) (dog) hair and dander: Secondary | ICD-10-CM | POA: Diagnosis not present

## 2016-12-27 DIAGNOSIS — J301 Allergic rhinitis due to pollen: Secondary | ICD-10-CM | POA: Diagnosis not present

## 2017-01-04 DIAGNOSIS — M4802 Spinal stenosis, cervical region: Secondary | ICD-10-CM | POA: Diagnosis not present

## 2017-01-04 DIAGNOSIS — R200 Anesthesia of skin: Secondary | ICD-10-CM | POA: Diagnosis not present

## 2017-01-04 DIAGNOSIS — M542 Cervicalgia: Secondary | ICD-10-CM | POA: Diagnosis not present

## 2017-01-06 DIAGNOSIS — J3081 Allergic rhinitis due to animal (cat) (dog) hair and dander: Secondary | ICD-10-CM | POA: Diagnosis not present

## 2017-01-06 DIAGNOSIS — J301 Allergic rhinitis due to pollen: Secondary | ICD-10-CM | POA: Diagnosis not present

## 2017-01-06 DIAGNOSIS — J3089 Other allergic rhinitis: Secondary | ICD-10-CM | POA: Diagnosis not present

## 2017-01-12 ENCOUNTER — Other Ambulatory Visit: Payer: Self-pay | Admitting: Neurosurgery

## 2017-01-12 DIAGNOSIS — M4802 Spinal stenosis, cervical region: Secondary | ICD-10-CM

## 2017-01-16 DIAGNOSIS — J3081 Allergic rhinitis due to animal (cat) (dog) hair and dander: Secondary | ICD-10-CM | POA: Diagnosis not present

## 2017-01-16 DIAGNOSIS — J3089 Other allergic rhinitis: Secondary | ICD-10-CM | POA: Diagnosis not present

## 2017-01-16 DIAGNOSIS — J301 Allergic rhinitis due to pollen: Secondary | ICD-10-CM | POA: Diagnosis not present

## 2017-01-18 ENCOUNTER — Ambulatory Visit
Admission: RE | Admit: 2017-01-18 | Discharge: 2017-01-18 | Disposition: A | Discharge status: Home / Self Care | Payer: 59 | Source: Ambulatory Visit | Attending: Neurosurgery | Admitting: Neurosurgery

## 2017-01-18 DIAGNOSIS — M5021 Other cervical disc displacement,  high cervical region: Secondary | ICD-10-CM | POA: Diagnosis not present

## 2017-01-18 DIAGNOSIS — M4802 Spinal stenosis, cervical region: Secondary | ICD-10-CM

## 2017-01-23 DIAGNOSIS — J3089 Other allergic rhinitis: Secondary | ICD-10-CM | POA: Diagnosis not present

## 2017-01-23 DIAGNOSIS — J301 Allergic rhinitis due to pollen: Secondary | ICD-10-CM | POA: Diagnosis not present

## 2017-01-23 DIAGNOSIS — J3081 Allergic rhinitis due to animal (cat) (dog) hair and dander: Secondary | ICD-10-CM | POA: Diagnosis not present

## 2017-02-13 DIAGNOSIS — J301 Allergic rhinitis due to pollen: Secondary | ICD-10-CM | POA: Diagnosis not present

## 2017-02-13 DIAGNOSIS — J3089 Other allergic rhinitis: Secondary | ICD-10-CM | POA: Diagnosis not present

## 2017-02-13 DIAGNOSIS — J3081 Allergic rhinitis due to animal (cat) (dog) hair and dander: Secondary | ICD-10-CM | POA: Diagnosis not present

## 2017-02-21 DIAGNOSIS — M542 Cervicalgia: Secondary | ICD-10-CM | POA: Diagnosis not present

## 2017-02-21 DIAGNOSIS — M5021 Other cervical disc displacement,  high cervical region: Secondary | ICD-10-CM | POA: Diagnosis not present

## 2017-02-21 DIAGNOSIS — M4802 Spinal stenosis, cervical region: Secondary | ICD-10-CM | POA: Diagnosis not present

## 2017-02-23 DIAGNOSIS — J3081 Allergic rhinitis due to animal (cat) (dog) hair and dander: Secondary | ICD-10-CM | POA: Diagnosis not present

## 2017-02-23 DIAGNOSIS — J3089 Other allergic rhinitis: Secondary | ICD-10-CM | POA: Diagnosis not present

## 2017-02-23 DIAGNOSIS — J301 Allergic rhinitis due to pollen: Secondary | ICD-10-CM | POA: Diagnosis not present

## 2017-03-07 DIAGNOSIS — J3089 Other allergic rhinitis: Secondary | ICD-10-CM | POA: Diagnosis not present

## 2017-03-07 DIAGNOSIS — J3081 Allergic rhinitis due to animal (cat) (dog) hair and dander: Secondary | ICD-10-CM | POA: Diagnosis not present

## 2017-03-07 DIAGNOSIS — J301 Allergic rhinitis due to pollen: Secondary | ICD-10-CM | POA: Diagnosis not present

## 2017-03-27 DIAGNOSIS — J3081 Allergic rhinitis due to animal (cat) (dog) hair and dander: Secondary | ICD-10-CM | POA: Diagnosis not present

## 2017-03-27 DIAGNOSIS — J3089 Other allergic rhinitis: Secondary | ICD-10-CM | POA: Diagnosis not present

## 2017-03-27 DIAGNOSIS — J301 Allergic rhinitis due to pollen: Secondary | ICD-10-CM | POA: Diagnosis not present

## 2017-04-14 DIAGNOSIS — J301 Allergic rhinitis due to pollen: Secondary | ICD-10-CM | POA: Diagnosis not present

## 2017-04-14 DIAGNOSIS — J3089 Other allergic rhinitis: Secondary | ICD-10-CM | POA: Diagnosis not present

## 2017-04-14 DIAGNOSIS — J3081 Allergic rhinitis due to animal (cat) (dog) hair and dander: Secondary | ICD-10-CM | POA: Diagnosis not present

## 2017-04-18 ENCOUNTER — Other Ambulatory Visit: Payer: Self-pay | Admitting: Family Medicine

## 2017-04-18 ENCOUNTER — Other Ambulatory Visit: Payer: Self-pay | Admitting: Internal Medicine

## 2017-04-18 DIAGNOSIS — Z131 Encounter for screening for diabetes mellitus: Secondary | ICD-10-CM | POA: Diagnosis not present

## 2017-04-18 DIAGNOSIS — D7589 Other specified diseases of blood and blood-forming organs: Secondary | ICD-10-CM | POA: Diagnosis not present

## 2017-04-18 DIAGNOSIS — Z0000 Encounter for general adult medical examination without abnormal findings: Secondary | ICD-10-CM | POA: Diagnosis not present

## 2017-04-18 DIAGNOSIS — Z124 Encounter for screening for malignant neoplasm of cervix: Secondary | ICD-10-CM | POA: Diagnosis not present

## 2017-04-18 DIAGNOSIS — Z1231 Encounter for screening mammogram for malignant neoplasm of breast: Secondary | ICD-10-CM

## 2017-04-18 DIAGNOSIS — Z136 Encounter for screening for cardiovascular disorders: Secondary | ICD-10-CM | POA: Diagnosis not present

## 2017-04-18 DIAGNOSIS — Z23 Encounter for immunization: Secondary | ICD-10-CM | POA: Diagnosis not present

## 2017-04-26 DIAGNOSIS — J3089 Other allergic rhinitis: Secondary | ICD-10-CM | POA: Diagnosis not present

## 2017-04-26 DIAGNOSIS — J3081 Allergic rhinitis due to animal (cat) (dog) hair and dander: Secondary | ICD-10-CM | POA: Diagnosis not present

## 2017-04-26 DIAGNOSIS — J301 Allergic rhinitis due to pollen: Secondary | ICD-10-CM | POA: Diagnosis not present

## 2017-05-16 ENCOUNTER — Ambulatory Visit
Admission: RE | Admit: 2017-05-16 | Discharge: 2017-05-16 | Disposition: A | Discharge status: Home / Self Care | Payer: 59 | Source: Ambulatory Visit | Attending: Internal Medicine | Admitting: Internal Medicine

## 2017-05-16 DIAGNOSIS — J3089 Other allergic rhinitis: Secondary | ICD-10-CM | POA: Diagnosis not present

## 2017-05-16 DIAGNOSIS — Z1231 Encounter for screening mammogram for malignant neoplasm of breast: Secondary | ICD-10-CM | POA: Diagnosis not present

## 2017-05-16 DIAGNOSIS — J301 Allergic rhinitis due to pollen: Secondary | ICD-10-CM | POA: Diagnosis not present

## 2017-05-16 DIAGNOSIS — J3081 Allergic rhinitis due to animal (cat) (dog) hair and dander: Secondary | ICD-10-CM | POA: Diagnosis not present

## 2017-05-26 DIAGNOSIS — J301 Allergic rhinitis due to pollen: Secondary | ICD-10-CM | POA: Diagnosis not present

## 2017-05-26 DIAGNOSIS — J3089 Other allergic rhinitis: Secondary | ICD-10-CM | POA: Diagnosis not present

## 2017-05-26 DIAGNOSIS — J3081 Allergic rhinitis due to animal (cat) (dog) hair and dander: Secondary | ICD-10-CM | POA: Diagnosis not present

## 2017-06-01 DIAGNOSIS — M542 Cervicalgia: Secondary | ICD-10-CM | POA: Diagnosis not present

## 2017-06-01 DIAGNOSIS — R200 Anesthesia of skin: Secondary | ICD-10-CM | POA: Diagnosis not present

## 2017-06-20 HISTORY — PX: NECK SURGERY: SHX720

## 2017-08-02 DIAGNOSIS — E538 Deficiency of other specified B group vitamins: Secondary | ICD-10-CM | POA: Diagnosis not present

## 2017-08-02 DIAGNOSIS — E559 Vitamin D deficiency, unspecified: Secondary | ICD-10-CM | POA: Diagnosis not present

## 2017-08-02 DIAGNOSIS — E039 Hypothyroidism, unspecified: Secondary | ICD-10-CM | POA: Diagnosis not present

## 2017-08-07 DIAGNOSIS — J301 Allergic rhinitis due to pollen: Secondary | ICD-10-CM | POA: Diagnosis not present

## 2017-08-07 DIAGNOSIS — J3081 Allergic rhinitis due to animal (cat) (dog) hair and dander: Secondary | ICD-10-CM | POA: Diagnosis not present

## 2017-08-08 DIAGNOSIS — J3089 Other allergic rhinitis: Secondary | ICD-10-CM | POA: Diagnosis not present

## 2017-08-16 DIAGNOSIS — J3089 Other allergic rhinitis: Secondary | ICD-10-CM | POA: Diagnosis not present

## 2017-08-16 DIAGNOSIS — J301 Allergic rhinitis due to pollen: Secondary | ICD-10-CM | POA: Diagnosis not present

## 2017-08-16 DIAGNOSIS — J3081 Allergic rhinitis due to animal (cat) (dog) hair and dander: Secondary | ICD-10-CM | POA: Diagnosis not present

## 2017-08-17 DIAGNOSIS — M4802 Spinal stenosis, cervical region: Secondary | ICD-10-CM | POA: Diagnosis not present

## 2017-08-25 DIAGNOSIS — J301 Allergic rhinitis due to pollen: Secondary | ICD-10-CM | POA: Diagnosis not present

## 2017-08-25 DIAGNOSIS — J3089 Other allergic rhinitis: Secondary | ICD-10-CM | POA: Diagnosis not present

## 2017-08-25 DIAGNOSIS — J3081 Allergic rhinitis due to animal (cat) (dog) hair and dander: Secondary | ICD-10-CM | POA: Diagnosis not present

## 2017-08-29 DIAGNOSIS — J301 Allergic rhinitis due to pollen: Secondary | ICD-10-CM | POA: Diagnosis not present

## 2017-08-29 DIAGNOSIS — J3089 Other allergic rhinitis: Secondary | ICD-10-CM | POA: Diagnosis not present

## 2017-08-29 DIAGNOSIS — J3081 Allergic rhinitis due to animal (cat) (dog) hair and dander: Secondary | ICD-10-CM | POA: Diagnosis not present

## 2017-09-05 DIAGNOSIS — J301 Allergic rhinitis due to pollen: Secondary | ICD-10-CM | POA: Diagnosis not present

## 2017-09-05 DIAGNOSIS — J4520 Mild intermittent asthma, uncomplicated: Secondary | ICD-10-CM | POA: Diagnosis not present

## 2017-09-05 DIAGNOSIS — J3081 Allergic rhinitis due to animal (cat) (dog) hair and dander: Secondary | ICD-10-CM | POA: Diagnosis not present

## 2017-09-05 DIAGNOSIS — J3089 Other allergic rhinitis: Secondary | ICD-10-CM | POA: Diagnosis not present

## 2017-09-13 DIAGNOSIS — J301 Allergic rhinitis due to pollen: Secondary | ICD-10-CM | POA: Diagnosis not present

## 2017-09-13 DIAGNOSIS — J3081 Allergic rhinitis due to animal (cat) (dog) hair and dander: Secondary | ICD-10-CM | POA: Diagnosis not present

## 2017-09-13 DIAGNOSIS — J3089 Other allergic rhinitis: Secondary | ICD-10-CM | POA: Diagnosis not present

## 2017-09-28 DIAGNOSIS — J3081 Allergic rhinitis due to animal (cat) (dog) hair and dander: Secondary | ICD-10-CM | POA: Diagnosis not present

## 2017-09-28 DIAGNOSIS — J301 Allergic rhinitis due to pollen: Secondary | ICD-10-CM | POA: Diagnosis not present

## 2017-09-28 DIAGNOSIS — J3089 Other allergic rhinitis: Secondary | ICD-10-CM | POA: Diagnosis not present

## 2017-10-04 DIAGNOSIS — J3081 Allergic rhinitis due to animal (cat) (dog) hair and dander: Secondary | ICD-10-CM | POA: Diagnosis not present

## 2017-10-04 DIAGNOSIS — J301 Allergic rhinitis due to pollen: Secondary | ICD-10-CM | POA: Diagnosis not present

## 2017-10-04 DIAGNOSIS — J3089 Other allergic rhinitis: Secondary | ICD-10-CM | POA: Diagnosis not present

## 2017-10-12 DIAGNOSIS — J3081 Allergic rhinitis due to animal (cat) (dog) hair and dander: Secondary | ICD-10-CM | POA: Diagnosis not present

## 2017-10-12 DIAGNOSIS — J3089 Other allergic rhinitis: Secondary | ICD-10-CM | POA: Diagnosis not present

## 2017-10-12 DIAGNOSIS — J301 Allergic rhinitis due to pollen: Secondary | ICD-10-CM | POA: Diagnosis not present

## 2017-10-17 DIAGNOSIS — J3089 Other allergic rhinitis: Secondary | ICD-10-CM | POA: Diagnosis not present

## 2017-10-17 DIAGNOSIS — J3081 Allergic rhinitis due to animal (cat) (dog) hair and dander: Secondary | ICD-10-CM | POA: Diagnosis not present

## 2017-10-17 DIAGNOSIS — J301 Allergic rhinitis due to pollen: Secondary | ICD-10-CM | POA: Diagnosis not present

## 2017-10-23 DIAGNOSIS — J3089 Other allergic rhinitis: Secondary | ICD-10-CM | POA: Diagnosis not present

## 2017-10-23 DIAGNOSIS — J3081 Allergic rhinitis due to animal (cat) (dog) hair and dander: Secondary | ICD-10-CM | POA: Diagnosis not present

## 2017-10-23 DIAGNOSIS — J301 Allergic rhinitis due to pollen: Secondary | ICD-10-CM | POA: Diagnosis not present

## 2017-10-27 DIAGNOSIS — J3089 Other allergic rhinitis: Secondary | ICD-10-CM | POA: Diagnosis not present

## 2017-10-27 DIAGNOSIS — J301 Allergic rhinitis due to pollen: Secondary | ICD-10-CM | POA: Diagnosis not present

## 2017-10-27 DIAGNOSIS — J3081 Allergic rhinitis due to animal (cat) (dog) hair and dander: Secondary | ICD-10-CM | POA: Diagnosis not present

## 2017-11-03 DIAGNOSIS — J3089 Other allergic rhinitis: Secondary | ICD-10-CM | POA: Diagnosis not present

## 2017-11-03 DIAGNOSIS — J3081 Allergic rhinitis due to animal (cat) (dog) hair and dander: Secondary | ICD-10-CM | POA: Diagnosis not present

## 2017-11-03 DIAGNOSIS — J301 Allergic rhinitis due to pollen: Secondary | ICD-10-CM | POA: Diagnosis not present

## 2017-11-07 DIAGNOSIS — J301 Allergic rhinitis due to pollen: Secondary | ICD-10-CM | POA: Diagnosis not present

## 2017-11-07 DIAGNOSIS — J3081 Allergic rhinitis due to animal (cat) (dog) hair and dander: Secondary | ICD-10-CM | POA: Diagnosis not present

## 2017-11-07 DIAGNOSIS — J3089 Other allergic rhinitis: Secondary | ICD-10-CM | POA: Diagnosis not present

## 2017-11-15 DIAGNOSIS — E538 Deficiency of other specified B group vitamins: Secondary | ICD-10-CM | POA: Diagnosis not present

## 2017-11-15 DIAGNOSIS — E039 Hypothyroidism, unspecified: Secondary | ICD-10-CM | POA: Diagnosis not present

## 2017-11-15 DIAGNOSIS — J3081 Allergic rhinitis due to animal (cat) (dog) hair and dander: Secondary | ICD-10-CM | POA: Diagnosis not present

## 2017-11-15 DIAGNOSIS — J3089 Other allergic rhinitis: Secondary | ICD-10-CM | POA: Diagnosis not present

## 2017-11-15 DIAGNOSIS — J301 Allergic rhinitis due to pollen: Secondary | ICD-10-CM | POA: Diagnosis not present

## 2017-11-15 DIAGNOSIS — N951 Menopausal and female climacteric states: Secondary | ICD-10-CM | POA: Diagnosis not present

## 2017-11-27 DIAGNOSIS — J3089 Other allergic rhinitis: Secondary | ICD-10-CM | POA: Diagnosis not present

## 2017-11-27 DIAGNOSIS — J3081 Allergic rhinitis due to animal (cat) (dog) hair and dander: Secondary | ICD-10-CM | POA: Diagnosis not present

## 2017-11-27 DIAGNOSIS — J301 Allergic rhinitis due to pollen: Secondary | ICD-10-CM | POA: Diagnosis not present

## 2017-12-04 DIAGNOSIS — M4802 Spinal stenosis, cervical region: Secondary | ICD-10-CM | POA: Diagnosis not present

## 2017-12-22 DIAGNOSIS — M5412 Radiculopathy, cervical region: Secondary | ICD-10-CM | POA: Diagnosis not present

## 2017-12-25 DIAGNOSIS — J301 Allergic rhinitis due to pollen: Secondary | ICD-10-CM | POA: Diagnosis not present

## 2017-12-25 DIAGNOSIS — J3089 Other allergic rhinitis: Secondary | ICD-10-CM | POA: Diagnosis not present

## 2017-12-25 DIAGNOSIS — J3081 Allergic rhinitis due to animal (cat) (dog) hair and dander: Secondary | ICD-10-CM | POA: Diagnosis not present

## 2017-12-28 ENCOUNTER — Other Ambulatory Visit: Payer: Self-pay | Admitting: Family Medicine

## 2017-12-28 DIAGNOSIS — M5412 Radiculopathy, cervical region: Secondary | ICD-10-CM

## 2017-12-29 DIAGNOSIS — J3081 Allergic rhinitis due to animal (cat) (dog) hair and dander: Secondary | ICD-10-CM | POA: Diagnosis not present

## 2017-12-29 DIAGNOSIS — J3089 Other allergic rhinitis: Secondary | ICD-10-CM | POA: Diagnosis not present

## 2017-12-29 DIAGNOSIS — J301 Allergic rhinitis due to pollen: Secondary | ICD-10-CM | POA: Diagnosis not present

## 2018-01-01 ENCOUNTER — Ambulatory Visit
Admission: RE | Admit: 2018-01-01 | Discharge: 2018-01-01 | Disposition: A | Discharge status: Home / Self Care | Payer: 59 | Source: Ambulatory Visit | Attending: Family Medicine | Admitting: Family Medicine

## 2018-01-01 DIAGNOSIS — M5412 Radiculopathy, cervical region: Secondary | ICD-10-CM

## 2018-01-01 DIAGNOSIS — M4802 Spinal stenosis, cervical region: Secondary | ICD-10-CM | POA: Diagnosis not present

## 2018-01-03 DIAGNOSIS — J301 Allergic rhinitis due to pollen: Secondary | ICD-10-CM | POA: Diagnosis not present

## 2018-01-03 DIAGNOSIS — J3081 Allergic rhinitis due to animal (cat) (dog) hair and dander: Secondary | ICD-10-CM | POA: Diagnosis not present

## 2018-01-03 DIAGNOSIS — J3089 Other allergic rhinitis: Secondary | ICD-10-CM | POA: Diagnosis not present

## 2018-01-05 DIAGNOSIS — J301 Allergic rhinitis due to pollen: Secondary | ICD-10-CM | POA: Diagnosis not present

## 2018-01-05 DIAGNOSIS — J3089 Other allergic rhinitis: Secondary | ICD-10-CM | POA: Diagnosis not present

## 2018-01-05 DIAGNOSIS — J3081 Allergic rhinitis due to animal (cat) (dog) hair and dander: Secondary | ICD-10-CM | POA: Diagnosis not present

## 2018-01-09 DIAGNOSIS — J3081 Allergic rhinitis due to animal (cat) (dog) hair and dander: Secondary | ICD-10-CM | POA: Diagnosis not present

## 2018-01-09 DIAGNOSIS — J301 Allergic rhinitis due to pollen: Secondary | ICD-10-CM | POA: Diagnosis not present

## 2018-01-09 DIAGNOSIS — J3089 Other allergic rhinitis: Secondary | ICD-10-CM | POA: Diagnosis not present

## 2018-01-31 DIAGNOSIS — J301 Allergic rhinitis due to pollen: Secondary | ICD-10-CM | POA: Diagnosis not present

## 2018-01-31 DIAGNOSIS — J3081 Allergic rhinitis due to animal (cat) (dog) hair and dander: Secondary | ICD-10-CM | POA: Diagnosis not present

## 2018-01-31 DIAGNOSIS — J3089 Other allergic rhinitis: Secondary | ICD-10-CM | POA: Diagnosis not present

## 2018-02-06 DIAGNOSIS — J3081 Allergic rhinitis due to animal (cat) (dog) hair and dander: Secondary | ICD-10-CM | POA: Diagnosis not present

## 2018-02-06 DIAGNOSIS — J301 Allergic rhinitis due to pollen: Secondary | ICD-10-CM | POA: Diagnosis not present

## 2018-02-06 DIAGNOSIS — J3089 Other allergic rhinitis: Secondary | ICD-10-CM | POA: Diagnosis not present

## 2018-02-14 ENCOUNTER — Telehealth (INDEPENDENT_AMBULATORY_CARE_PROVIDER_SITE_OTHER): Payer: Self-pay | Admitting: Orthopaedic Surgery

## 2018-02-14 DIAGNOSIS — M4802 Spinal stenosis, cervical region: Secondary | ICD-10-CM | POA: Diagnosis not present

## 2018-02-14 DIAGNOSIS — M542 Cervicalgia: Secondary | ICD-10-CM | POA: Diagnosis not present

## 2018-02-14 DIAGNOSIS — R200 Anesthesia of skin: Secondary | ICD-10-CM | POA: Diagnosis not present

## 2018-02-14 NOTE — Telephone Encounter (Signed)
Patient called, she is needing to know the type of anesthesia used from her 2015 surgery by Dr Magnus IvanBlackman (as she had a bad reaction from it) Callback [phone]

## 2018-02-14 NOTE — Telephone Encounter (Signed)
See below

## 2018-02-14 NOTE — Telephone Encounter (Signed)
The patient had spinal anesthesia for hip replacement surgery and likely some type of sedation.  She would actually need to contact the hospital and the anesthesiologist for what is actually used in terms of the mixture in the spinal anesthesia or the sedation that the use for those types of cases.

## 2018-02-15 NOTE — Telephone Encounter (Signed)
Patient aware of the below message  

## 2018-02-28 DIAGNOSIS — J3081 Allergic rhinitis due to animal (cat) (dog) hair and dander: Secondary | ICD-10-CM | POA: Diagnosis not present

## 2018-02-28 DIAGNOSIS — J3089 Other allergic rhinitis: Secondary | ICD-10-CM | POA: Diagnosis not present

## 2018-02-28 DIAGNOSIS — J301 Allergic rhinitis due to pollen: Secondary | ICD-10-CM | POA: Diagnosis not present

## 2018-03-07 DIAGNOSIS — J3081 Allergic rhinitis due to animal (cat) (dog) hair and dander: Secondary | ICD-10-CM | POA: Diagnosis not present

## 2018-03-07 DIAGNOSIS — J3089 Other allergic rhinitis: Secondary | ICD-10-CM | POA: Diagnosis not present

## 2018-03-07 DIAGNOSIS — J301 Allergic rhinitis due to pollen: Secondary | ICD-10-CM | POA: Diagnosis not present

## 2018-03-14 DIAGNOSIS — E039 Hypothyroidism, unspecified: Secondary | ICD-10-CM | POA: Diagnosis not present

## 2018-03-14 DIAGNOSIS — E559 Vitamin D deficiency, unspecified: Secondary | ICD-10-CM | POA: Diagnosis not present

## 2018-03-14 DIAGNOSIS — N951 Menopausal and female climacteric states: Secondary | ICD-10-CM | POA: Diagnosis not present

## 2018-03-14 DIAGNOSIS — R5383 Other fatigue: Secondary | ICD-10-CM | POA: Diagnosis not present

## 2018-03-19 DIAGNOSIS — J3089 Other allergic rhinitis: Secondary | ICD-10-CM | POA: Diagnosis not present

## 2018-03-19 DIAGNOSIS — J3081 Allergic rhinitis due to animal (cat) (dog) hair and dander: Secondary | ICD-10-CM | POA: Diagnosis not present

## 2018-03-19 DIAGNOSIS — J301 Allergic rhinitis due to pollen: Secondary | ICD-10-CM | POA: Diagnosis not present

## 2018-03-26 DIAGNOSIS — J301 Allergic rhinitis due to pollen: Secondary | ICD-10-CM | POA: Diagnosis not present

## 2018-03-26 DIAGNOSIS — J3089 Other allergic rhinitis: Secondary | ICD-10-CM | POA: Diagnosis not present

## 2018-03-26 DIAGNOSIS — J3081 Allergic rhinitis due to animal (cat) (dog) hair and dander: Secondary | ICD-10-CM | POA: Diagnosis not present

## 2018-04-02 DIAGNOSIS — J301 Allergic rhinitis due to pollen: Secondary | ICD-10-CM | POA: Diagnosis not present

## 2018-04-02 DIAGNOSIS — J3081 Allergic rhinitis due to animal (cat) (dog) hair and dander: Secondary | ICD-10-CM | POA: Diagnosis not present

## 2018-04-02 DIAGNOSIS — J3089 Other allergic rhinitis: Secondary | ICD-10-CM | POA: Diagnosis not present

## 2018-04-13 DIAGNOSIS — J301 Allergic rhinitis due to pollen: Secondary | ICD-10-CM | POA: Diagnosis not present

## 2018-04-13 DIAGNOSIS — J3089 Other allergic rhinitis: Secondary | ICD-10-CM | POA: Diagnosis not present

## 2018-04-13 DIAGNOSIS — J3081 Allergic rhinitis due to animal (cat) (dog) hair and dander: Secondary | ICD-10-CM | POA: Diagnosis not present

## 2018-04-17 DIAGNOSIS — M5020 Other cervical disc displacement, unspecified cervical region: Secondary | ICD-10-CM | POA: Diagnosis not present

## 2018-04-17 DIAGNOSIS — M4802 Spinal stenosis, cervical region: Secondary | ICD-10-CM | POA: Diagnosis not present

## 2018-04-17 DIAGNOSIS — M50121 Cervical disc disorder at C4-C5 level with radiculopathy: Secondary | ICD-10-CM | POA: Diagnosis not present

## 2018-04-17 DIAGNOSIS — M50221 Other cervical disc displacement at C4-C5 level: Secondary | ICD-10-CM | POA: Diagnosis not present

## 2018-04-17 DIAGNOSIS — M4722 Other spondylosis with radiculopathy, cervical region: Secondary | ICD-10-CM | POA: Diagnosis not present

## 2018-05-01 DIAGNOSIS — J3089 Other allergic rhinitis: Secondary | ICD-10-CM | POA: Diagnosis not present

## 2018-05-01 DIAGNOSIS — J301 Allergic rhinitis due to pollen: Secondary | ICD-10-CM | POA: Diagnosis not present

## 2018-05-01 DIAGNOSIS — J3081 Allergic rhinitis due to animal (cat) (dog) hair and dander: Secondary | ICD-10-CM | POA: Diagnosis not present

## 2018-05-09 DIAGNOSIS — M5412 Radiculopathy, cervical region: Secondary | ICD-10-CM | POA: Diagnosis not present

## 2018-05-14 DIAGNOSIS — J3089 Other allergic rhinitis: Secondary | ICD-10-CM | POA: Diagnosis not present

## 2018-05-14 DIAGNOSIS — J3081 Allergic rhinitis due to animal (cat) (dog) hair and dander: Secondary | ICD-10-CM | POA: Diagnosis not present

## 2018-05-14 DIAGNOSIS — J301 Allergic rhinitis due to pollen: Secondary | ICD-10-CM | POA: Diagnosis not present

## 2018-05-22 DIAGNOSIS — G479 Sleep disorder, unspecified: Secondary | ICD-10-CM | POA: Diagnosis not present

## 2018-05-22 DIAGNOSIS — E039 Hypothyroidism, unspecified: Secondary | ICD-10-CM | POA: Diagnosis not present

## 2018-05-22 DIAGNOSIS — Z1211 Encounter for screening for malignant neoplasm of colon: Secondary | ICD-10-CM | POA: Diagnosis not present

## 2018-06-19 DIAGNOSIS — J3081 Allergic rhinitis due to animal (cat) (dog) hair and dander: Secondary | ICD-10-CM | POA: Diagnosis not present

## 2018-06-19 DIAGNOSIS — J301 Allergic rhinitis due to pollen: Secondary | ICD-10-CM | POA: Diagnosis not present

## 2018-06-19 DIAGNOSIS — J3089 Other allergic rhinitis: Secondary | ICD-10-CM | POA: Diagnosis not present

## 2018-07-02 DIAGNOSIS — J301 Allergic rhinitis due to pollen: Secondary | ICD-10-CM | POA: Diagnosis not present

## 2018-07-02 DIAGNOSIS — J3089 Other allergic rhinitis: Secondary | ICD-10-CM | POA: Diagnosis not present

## 2018-07-02 DIAGNOSIS — J3081 Allergic rhinitis due to animal (cat) (dog) hair and dander: Secondary | ICD-10-CM | POA: Diagnosis not present

## 2018-07-04 DIAGNOSIS — M4802 Spinal stenosis, cervical region: Secondary | ICD-10-CM | POA: Diagnosis not present

## 2018-07-18 DIAGNOSIS — N951 Menopausal and female climacteric states: Secondary | ICD-10-CM | POA: Diagnosis not present

## 2018-07-18 DIAGNOSIS — E039 Hypothyroidism, unspecified: Secondary | ICD-10-CM | POA: Diagnosis not present

## 2018-07-18 DIAGNOSIS — E538 Deficiency of other specified B group vitamins: Secondary | ICD-10-CM | POA: Diagnosis not present

## 2018-07-23 DIAGNOSIS — J3081 Allergic rhinitis due to animal (cat) (dog) hair and dander: Secondary | ICD-10-CM | POA: Diagnosis not present

## 2018-07-23 DIAGNOSIS — J301 Allergic rhinitis due to pollen: Secondary | ICD-10-CM | POA: Diagnosis not present

## 2018-07-23 DIAGNOSIS — J3089 Other allergic rhinitis: Secondary | ICD-10-CM | POA: Diagnosis not present

## 2018-07-25 DIAGNOSIS — J301 Allergic rhinitis due to pollen: Secondary | ICD-10-CM | POA: Diagnosis not present

## 2018-07-25 DIAGNOSIS — J3089 Other allergic rhinitis: Secondary | ICD-10-CM | POA: Diagnosis not present

## 2018-07-26 DIAGNOSIS — J3089 Other allergic rhinitis: Secondary | ICD-10-CM | POA: Diagnosis not present

## 2018-09-06 DIAGNOSIS — J301 Allergic rhinitis due to pollen: Secondary | ICD-10-CM | POA: Diagnosis not present

## 2018-09-06 DIAGNOSIS — J3081 Allergic rhinitis due to animal (cat) (dog) hair and dander: Secondary | ICD-10-CM | POA: Diagnosis not present

## 2018-09-06 DIAGNOSIS — J3089 Other allergic rhinitis: Secondary | ICD-10-CM | POA: Diagnosis not present

## 2018-10-15 DIAGNOSIS — M545 Low back pain: Secondary | ICD-10-CM | POA: Diagnosis not present

## 2018-10-15 DIAGNOSIS — M4316 Spondylolisthesis, lumbar region: Secondary | ICD-10-CM | POA: Diagnosis not present

## 2018-10-15 DIAGNOSIS — M5416 Radiculopathy, lumbar region: Secondary | ICD-10-CM | POA: Diagnosis not present

## 2018-11-01 DIAGNOSIS — M5136 Other intervertebral disc degeneration, lumbar region: Secondary | ICD-10-CM | POA: Diagnosis not present

## 2018-11-01 DIAGNOSIS — R030 Elevated blood-pressure reading, without diagnosis of hypertension: Secondary | ICD-10-CM | POA: Diagnosis not present

## 2018-11-01 DIAGNOSIS — M5416 Radiculopathy, lumbar region: Secondary | ICD-10-CM | POA: Diagnosis not present

## 2018-12-12 ENCOUNTER — Other Ambulatory Visit: Payer: Self-pay | Admitting: Neurosurgery

## 2018-12-12 DIAGNOSIS — M545 Low back pain, unspecified: Secondary | ICD-10-CM

## 2018-12-21 ENCOUNTER — Ambulatory Visit
Admission: RE | Admit: 2018-12-21 | Discharge: 2018-12-21 | Disposition: A | Discharge status: Home / Self Care | Payer: 59 | Source: Ambulatory Visit | Attending: Neurosurgery | Admitting: Neurosurgery

## 2018-12-21 ENCOUNTER — Other Ambulatory Visit: Payer: Self-pay

## 2018-12-21 DIAGNOSIS — M545 Low back pain, unspecified: Secondary | ICD-10-CM

## 2019-01-05 ENCOUNTER — Other Ambulatory Visit: Payer: 59

## 2019-08-05 ENCOUNTER — Other Ambulatory Visit: Payer: Self-pay | Admitting: Family Medicine

## 2019-08-05 DIAGNOSIS — Z13820 Encounter for screening for osteoporosis: Secondary | ICD-10-CM

## 2019-08-05 DIAGNOSIS — Z1231 Encounter for screening mammogram for malignant neoplasm of breast: Secondary | ICD-10-CM

## 2019-08-12 ENCOUNTER — Ambulatory Visit: Payer: 59

## 2019-08-20 ENCOUNTER — Other Ambulatory Visit: Payer: 59

## 2019-08-23 ENCOUNTER — Ambulatory Visit: Payer: 59 | Attending: Internal Medicine

## 2019-08-23 DIAGNOSIS — Z20822 Contact with and (suspected) exposure to covid-19: Secondary | ICD-10-CM

## 2019-08-24 LAB — NOVEL CORONAVIRUS, NAA: SARS-CoV-2, NAA: NOT DETECTED

## 2019-10-16 ENCOUNTER — Ambulatory Visit (INDEPENDENT_AMBULATORY_CARE_PROVIDER_SITE_OTHER): Payer: 59 | Admitting: Nurse Practitioner

## 2019-10-16 ENCOUNTER — Other Ambulatory Visit (INDEPENDENT_AMBULATORY_CARE_PROVIDER_SITE_OTHER): Payer: 59

## 2019-10-16 ENCOUNTER — Encounter: Payer: Self-pay | Admitting: Nurse Practitioner

## 2019-10-16 ENCOUNTER — Other Ambulatory Visit: Payer: Self-pay

## 2019-10-16 VITALS — BP 138/78 | HR 85 | Temp 98.2°F | Ht 62.0 in | Wt 128.0 lb

## 2019-10-16 DIAGNOSIS — R1013 Epigastric pain: Secondary | ICD-10-CM

## 2019-10-16 DIAGNOSIS — R0989 Other specified symptoms and signs involving the circulatory and respiratory systems: Secondary | ICD-10-CM

## 2019-10-16 DIAGNOSIS — R6881 Early satiety: Secondary | ICD-10-CM

## 2019-10-16 DIAGNOSIS — R634 Abnormal weight loss: Secondary | ICD-10-CM | POA: Diagnosis not present

## 2019-10-16 DIAGNOSIS — Z01818 Encounter for other preprocedural examination: Secondary | ICD-10-CM

## 2019-10-16 LAB — HEPATIC FUNCTION PANEL
ALT: 18 U/L (ref 0–35)
AST: 19 U/L (ref 0–37)
Albumin: 4.5 g/dL (ref 3.5–5.2)
Alkaline Phosphatase: 119 U/L — ABNORMAL HIGH (ref 39–117)
Bilirubin, Direct: 0.1 mg/dL (ref 0.0–0.3)
Total Bilirubin: 0.3 mg/dL (ref 0.2–1.2)
Total Protein: 7 g/dL (ref 6.0–8.3)

## 2019-10-16 LAB — LIPASE: Lipase: 38 U/L (ref 11.0–59.0)
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
## 2019-10-16 NOTE — Addendum Note (Signed)
Addended by: Mariane Duval on: 10/16/2019 04:49 PM   Modules accepted: Orders

## 2019-10-16 NOTE — Patient Instructions (Signed)
If you are age 56 or older, your body mass index should be between 23-30. Your Body mass index is 23.41 kg/m. If this is out of the aforementioned range listed, please consider follow up with your Primary Care Provider.  If you are age 29 or younger, your body mass index should be between 19-25. Your Body mass index is 23.41 kg/m. If this is out of the aformentioned range listed, please consider follow up with your Primary Care Provider.   Continue Pantoprazole.   You have been scheduled for an endoscopy. Please follow written instructions given to you at your visit today. If you use inhalers (even only as needed), please bring them with you on the day of your procedure.

## 2019-10-17 ENCOUNTER — Other Ambulatory Visit: Payer: Self-pay

## 2019-10-17 DIAGNOSIS — R748 Abnormal levels of other serum enzymes: Secondary | ICD-10-CM

## 2019-10-21 ENCOUNTER — Encounter: Payer: Self-pay | Admitting: Gastroenterology

## 2019-10-22 ENCOUNTER — Ambulatory Visit (INDEPENDENT_AMBULATORY_CARE_PROVIDER_SITE_OTHER): Payer: 59

## 2019-10-22 DIAGNOSIS — Z1159 Encounter for screening for other viral diseases: Secondary | ICD-10-CM

## 2019-10-22 LAB — SARS CORONAVIRUS 2 (TAT 6-24 HRS): SARS Coronavirus 2: NEGATIVE

## 2019-10-22 NOTE — Progress Notes (Signed)
Reviewed and agree with documentation and assessment and plan. K. Veena Kylieann Eagles , MD   

## 2019-10-24 ENCOUNTER — Ambulatory Visit (AMBULATORY_SURGERY_CENTER): Payer: 59 | Admitting: Gastroenterology

## 2019-10-24 ENCOUNTER — Encounter: Payer: Self-pay | Admitting: Gastroenterology

## 2019-10-24 ENCOUNTER — Other Ambulatory Visit: Payer: Self-pay

## 2019-10-24 VITALS — BP 119/72 | HR 60 | Temp 97.7°F | Resp 18 | Ht 62.0 in | Wt 128.0 lb

## 2019-10-24 DIAGNOSIS — R0989 Other specified symptoms and signs involving the circulatory and respiratory systems: Secondary | ICD-10-CM

## 2019-10-24 DIAGNOSIS — B3781 Candidal esophagitis: Secondary | ICD-10-CM

## 2019-10-24 DIAGNOSIS — K2970 Gastritis, unspecified, without bleeding: Secondary | ICD-10-CM | POA: Diagnosis not present

## 2019-10-24 DIAGNOSIS — R1013 Epigastric pain: Secondary | ICD-10-CM

## 2019-10-24 DIAGNOSIS — K209 Esophagitis, unspecified without bleeding: Secondary | ICD-10-CM

## 2019-10-24 DIAGNOSIS — K219 Gastro-esophageal reflux disease without esophagitis: Secondary | ICD-10-CM | POA: Diagnosis present

## 2019-10-24 MED ORDER — SODIUM CHLORIDE 0.9 % IV SOLN: 500.0000 mL | Freq: Once | INTRAVENOUS | Status: DC

## 2019-10-24 MED ORDER — FLUCONAZOLE 100 MG PO TABS
100.0000 mg | ORAL_TABLET | Freq: Every day | ORAL | 0 refills | Status: DC
Start: 2019-10-24 — End: 2020-08-12

## 2019-10-24 NOTE — Progress Notes (Signed)
Temp JB V/S CW 

## 2019-10-24 NOTE — Progress Notes (Signed)
Called to room to assist during endoscopic procedure.  Patient ID and intended procedure confirmed with present staff. Received instructions for my participation in the procedure from the performing physician.  

## 2019-10-24 NOTE — Op Note (Signed)
[facility] Patient Name: Tracy Ross Procedure Date: 10/24/2019 10:10 AM MRN: [id number] Endoscopist: Napoleon Form , MD Age: 54 Referring MD:  Date of Birth: [birth year] Gender: Female Account #: [id number] Procedure:                Upper GI endoscopy Indications:              Epigastric abdominal pain, Anorexia, Globus                            sensation, Weight loss Medicines:                Monitored Anesthesia Care Procedure:                Pre-Anesthesia Assessment:                           - Prior to the procedure, a History and Physical                            was performed, and patient medications and                            allergies were reviewed. The patient's tolerance of                            previous anesthesia was also reviewed. The risks                            and benefits of the procedure and the sedation                            options and risks were discussed with the patient.                            All questions were answered, and informed consent                            was obtained. Prior Anticoagulants: The patient has                            taken no previous anticoagulant or antiplatelet                            agents. ASA Grade Assessment: II - A patient with                            mild systemic disease. After reviewing the risks                            and benefits, the patient was deemed in                            satisfactory condition to undergo the procedure.  After obtaining informed consent, the endoscope was                            passed under direct vision. Throughout the                            procedure, the patient's blood pressure, pulse, and                            oxygen saturations were monitored continuously. The                            Endoscope was introduced through the mouth, and                            advanced to the second part of  duodenum. The upper                            GI endoscopy was accomplished without difficulty.                            The patient tolerated the procedure well. Scope In: Scope Out: Findings:                 LA Grade B (one or more mucosal breaks greater than                            5 mm, not extending between the tops of two mucosal                            folds) esophagitis was found 32 to 35 cm from the                            incisors. Biopsies were taken with a cold forceps                            for histology.                           The gastroesophageal flap valve was visualized                            endoscopically and classified as Hill Grade II                            (fold present, opens with respiration).                           Patchy mild inflammation characterized by                            congestion (edema) and erythema was found in the  cardia, in the gastric antrum and in the prepyloric                            region of the stomach. Biopsies were taken with a                            cold forceps for Helicobacter pylori testing.                           The examined duodenum was normal. Complications:            No immediate complications. Estimated Blood Loss:     Estimated blood loss was minimal. Impression:               - LA Grade B reflux and candidiasis esophagitis.                            Biopsied.                           - Gastroesophageal flap valve classified as Hill                            Grade II (fold present, opens with respiration).                           - Gastritis. Biopsied.                           - Normal examined duodenum. Recommendation:           - Patient has a contact number available for                            emergencies. The signs and symptoms of potential                            delayed complications were discussed with the                             patient. Return to normal activities tomorrow.                            Written discharge instructions were provided to the                            patient.                           - Resume previous diet.                           - Continue present medications.                           - Await pathology results.                           -  Use Prilosec (omeprazole) 20 mg PO daily for 3                            months.                           - Follow an antireflux regimen indefinitely.                           - Diflucan (fluconazole) 100 mg PO daily for 7 days. Mauri Pole, MD 10/24/2019 10:29:56 AM This report has been signed electronically.

## 2019-10-24 NOTE — Progress Notes (Signed)
To PACU, VSS. Report to [email or web address] 

## 2019-10-24 NOTE — Patient Instructions (Signed)
Please read handouts provided. Continue present medications. Await pathology results. Follow an antireflux regimen indefinitely. Diflucan ( fluconazole ) 100 mg daily for 7 days. Continue Prilosec ( omeprazole ) 20 mg once to twice daily for 3 months.       YOU HAD AN ENDOSCOPIC PROCEDURE TODAY AT THE [facility]:   Refer to the procedure report that was given to you for any specific questions about what was found during the examination.  If the procedure report does not answer your questions, please call your gastroenterologist to clarify.  If you requested that your care partner not be given the details of your procedure findings, then the procedure report has been included in a sealed envelope for you to review at your convenience later.  YOU SHOULD EXPECT: Some feelings of bloating in the abdomen. Passage of more gas than usual.  Walking can help get rid of the air that was put into your GI tract during the procedure and reduce the bloating. If you had a lower endoscopy (such as a colonoscopy or flexible sigmoidoscopy) you may notice spotting of blood in your stool or on the toilet paper. If you underwent a bowel prep for your procedure, you may not have a normal bowel movement for a few days.  Please Note:  You might notice some irritation and congestion in your nose or some drainage.  This is from the oxygen used during your procedure.  There is no need for concern and it should clear up in a day or so.  SYMPTOMS TO REPORT IMMEDIATELY:    Following upper endoscopy (EGD)  Vomiting of blood or coffee ground material  New chest pain or pain under the shoulder blades  Painful or persistently difficult swallowing  New shortness of breath  Fever of 100F or higher  Black, tarry-looking stools  For urgent or emergent issues, a gastroenterologist can be reached at any hour by calling [phone]. Do not use MyChart messaging for urgent concerns.    DIET:  We do  recommend a small meal at first, but then you may proceed to your regular diet.  Drink plenty of fluids but you should avoid alcoholic beverages for 24 hours.  ACTIVITY:  You should plan to take it easy for the rest of today and you should NOT DRIVE or use heavy machinery until tomorrow (because of the sedation medicines used during the test).    FOLLOW UP: Our staff will call the number listed on your records 48-72 hours following your procedure to check on you and address any questions or concerns that you may have regarding the information given to you following your procedure. If we do not reach you, we will leave a message.  We will attempt to reach you two times.  During this call, we will ask if you have developed any symptoms of COVID 19. If you develop any symptoms (ie: fever, flu-like symptoms, shortness of breath, cough etc.) before then, please call [phone].  If you test positive for Covid 19 in the 2 weeks post procedure, please call and report this information to [place].    If any biopsies were taken you will be contacted by phone or by letter within the next 1-3 weeks.  Please call us at [phone] if you have not heard about the biopsies in 3 weeks.    SIGNATURES/CONFIDENTIALITY: You and/or your care partner have signed paperwork which will be entered into your electronic medical record.  These signatures attest to the fact  that that the information above on your After Visit Summary has been reviewed and is understood.  Full responsibility of the confidentiality of this discharge information lies with you and/or your care-partner.

## 2019-10-28 ENCOUNTER — Telehealth: Payer: Self-pay

## 2019-10-28 NOTE — Telephone Encounter (Signed)
  Follow up Call-  Call back number 10/24/2019  Post procedure Call Back phone  # [phone]  Permission to leave phone message Yes  Some recent data might be hidden     Patient questions:  Do you have a fever, pain , or abdominal swelling? No. Pain Score  0 *  Have you tolerated food without any problems? Yes.    Have you been able to return to your normal activities? Yes.    Do you have any questions about your discharge instructions: Diet   No. Medications  No. Follow up visit  No.  Do you have questions or concerns about your Care? No.   Pt asked how long it will take for her sx to improve.  I told her usually she would see a difference within a week.  If she is not improving within 1 week to please let us know. Pt said she was already seen a improvement with her appetite.Lenice Llamas     Actions: * If pain score is 4 or above: No action needed, pain <4.  1. Have you developed a fever since your procedure? no  2.   Have you had an respiratory symptoms (SOB or cough) since your procedure? no  3.   Have you tested positive for COVID 19 since your procedure no  4.   Have you had any family members/close contacts diagnosed with the COVID 19 since your procedure?  no   If yes to any of these questions please route to Laverna Peace, RN and Charlett Lango, RN

## 2019-11-07 ENCOUNTER — Encounter: Payer: Self-pay | Admitting: Gastroenterology

## 2019-11-21 ENCOUNTER — Telehealth: Payer: Self-pay | Admitting: Orthopaedic Surgery

## 2019-11-21 NOTE — Telephone Encounter (Signed)
Received vm from patient wanting to get copy of records. IC,lmvm. Advised need to come in to sign authorization to release of records.

## 2019-12-03 ENCOUNTER — Telehealth: Payer: Self-pay | Admitting: Orthopaedic Surgery

## 2019-12-03 NOTE — Telephone Encounter (Signed)
Received from [facility] w/ [facility] [phone] she is having issues with getting records from [employer], whom processed their request. IC, lmvm with my direct ph# to call me.

## 2019-12-04 NOTE — Telephone Encounter (Signed)
Received return call from [facility]. [facility] records faxed to her [phone]

## 2020-01-22 ENCOUNTER — Telehealth: Payer: Self-pay | Admitting: Orthopaedic Surgery

## 2020-01-22 NOTE — Telephone Encounter (Signed)
Received vm from [place] w/ [facility] stating having problems getting 2016 records from [employer]. IC, lmvm [phone] advised I spoke with Adelina Mings on 6/16 regarding this same issue and that I faxed the records to [place] on 6/16.

## 2020-06-30 ENCOUNTER — Other Ambulatory Visit: Payer: Self-pay | Admitting: Orthopedic Surgery

## 2020-06-30 DIAGNOSIS — M4324 Fusion of spine, thoracic region: Secondary | ICD-10-CM

## 2020-06-30 DIAGNOSIS — Z4789 Encounter for other orthopedic aftercare: Secondary | ICD-10-CM

## 2020-06-30 DIAGNOSIS — M4013 Other secondary kyphosis, cervicothoracic region: Secondary | ICD-10-CM

## 2020-06-30 DIAGNOSIS — M4326 Fusion of spine, lumbar region: Secondary | ICD-10-CM

## 2020-07-01 ENCOUNTER — Telehealth: Payer: Self-pay | Admitting: Gastroenterology

## 2020-07-01 NOTE — Telephone Encounter (Signed)
Pt is requesting a call back from a nurse to obtain a refill on her DIFLUCAN since she is experiencing burning in the stomach and nausea again. Pt states this medication helped her last time.  [facility].

## 2020-07-01 NOTE — Telephone Encounter (Signed)
Please advise If I can send this for pt

## 2020-07-02 ENCOUNTER — Other Ambulatory Visit: Payer: Self-pay | Admitting: Orthopedic Surgery

## 2020-07-02 DIAGNOSIS — Z4789 Encounter for other orthopedic aftercare: Secondary | ICD-10-CM

## 2020-07-02 DIAGNOSIS — M4324 Fusion of spine, thoracic region: Secondary | ICD-10-CM

## 2020-07-02 DIAGNOSIS — M4013 Other secondary kyphosis, cervicothoracic region: Secondary | ICD-10-CM

## 2020-07-02 DIAGNOSIS — M4327 Fusion of spine, lumbosacral region: Secondary | ICD-10-CM

## 2020-07-02 NOTE — Telephone Encounter (Signed)
Please schedule office follow up visit soon with APP to evaluate. Will hold off refilling Fluconazole until she is evaluated

## 2020-07-02 NOTE — Telephone Encounter (Signed)
07/09/2020 at 11:50am with Colleen  Follow up for stomach burning   Pt aware of appointment

## 2020-07-07 ENCOUNTER — Telehealth: Payer: Self-pay
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
## 2020-07-07 NOTE — Telephone Encounter (Signed)
Patient was scheduled to see Jill Side on Thursday at 11:50, this is not an actual appointment time and was an error. Per [facility] patient needs moved. I have scheduled her for tomorrow with Jill Side at 10:00 AM.  I sent patient a MyChart message letting her know of this change and I have also called her and left a VM that her appt has been changed to Wed at 10:00 AM. I asked her to call us back to return Mychart message to confirm.

## 2020-07-08 ENCOUNTER — Encounter: Payer: Self-pay | Admitting: Nurse Practitioner

## 2020-07-08 ENCOUNTER — Ambulatory Visit (INDEPENDENT_AMBULATORY_CARE_PROVIDER_SITE_OTHER): Payer: BC Managed Care – PPO | Admitting: Nurse Practitioner

## 2020-07-08 ENCOUNTER — Other Ambulatory Visit (INDEPENDENT_AMBULATORY_CARE_PROVIDER_SITE_OTHER): Payer: BC Managed Care – PPO

## 2020-07-08 VITALS — BP 138/70 | HR 81 | Ht 62.0 in | Wt 132.0 lb

## 2020-07-08 DIAGNOSIS — R1013 Epigastric pain: Secondary | ICD-10-CM

## 2020-07-08 DIAGNOSIS — D649 Anemia, unspecified: Secondary | ICD-10-CM

## 2020-07-08 DIAGNOSIS — K219 Gastro-esophageal reflux disease without esophagitis: Secondary | ICD-10-CM | POA: Diagnosis not present

## 2020-07-08 LAB — CBC WITH DIFFERENTIAL/PLATELET
Basophils Absolute: 0 10*3/uL (ref 0.0–0.1)
Basophils Relative: 0.7 % (ref 0.0–3.0)
Eosinophils Absolute: 0.1 10*3/uL (ref 0.0–0.7)
Eosinophils Relative: 1 % (ref 0.0–5.0)
HCT: 43.5 % (ref 36.0–46.0)
Hemoglobin: 14.8 g/dL (ref 12.0–15.0)
Lymphocytes Relative: 21.9 % (ref 12.0–46.0)
Lymphs Abs: 1.6 10*3/uL (ref 0.7–4.0)
MCHC: 34.1 g/dL (ref 30.0–36.0)
MCV: 99.6 fl (ref 78.0–100.0)
Monocytes Absolute: 0.6 10*3/uL (ref 0.1–1.0)
Monocytes Relative: 8.1 % (ref 3.0–12.0)
Neutro Abs: 5 10*3/uL (ref 1.4–7.7)
Neutrophils Relative %: 68.3 % (ref 43.0–77.0)
Platelets: 388 10*3/uL (ref 150.0–400.0)
RBC: 4.37 Mil/uL (ref 3.87–5.11)
RDW: 12.8 % (ref 11.5–15.5)
WBC: 7.3 10*3/uL (ref 4.0–10.5)

## 2020-07-08 LAB — COMPREHENSIVE METABOLIC PANEL
ALT: 18 U/L (ref 0–35)
AST: 23 U/L (ref 0–37)
Albumin: 5.1 g/dL (ref 3.5–5.2)
Alkaline Phosphatase: 86 U/L (ref 39–117)
BUN: 18 mg/dL (ref 6–23)
CO2: 29 mEq/L (ref 19–32)
Calcium: 10 mg/dL (ref 8.4–10.5)
Chloride: 101 mEq/L (ref 96–112)
Creatinine, Ser: 0.9 mg/dL (ref 0.40–1.20)
GFR: 71.21 mL/min (ref >60.00)
Glucose, Bld: 97 mg/dL (ref 70–99)
Potassium: 4.5 mEq/L (ref 3.5–5.1)
Sodium: 137 mEq/L (ref 135–145)
Total Bilirubin: 0.5 mg/dL (ref 0.2–1.2)
Total Protein: 7.3 g/dL (ref 6.0–8.3)

## 2020-07-08 LAB — B12 AND FOLATE PANEL
Folate: >23.6 ng/mL (ref >5.9)
Vitamin B-12: >1526 pg/mL — ABNORMAL HIGH (ref 211–911)

## 2020-07-08 LAB — LIPASE: Lipase: 35 U/L (ref 11.0–59.0)

## 2020-07-08 MED ORDER — SUCRALFATE 1 G PO TABS
1.0000 g | ORAL_TABLET | Freq: Two times a day (BID) | ORAL | 0 refills | Status: DC
Start: 2020-07-08 — End: 2020-08-12

## 2020-07-08 NOTE — Patient Instructions (Addendum)
If you are age 57 or older, your body mass index should be between 23-30. Your Body mass index is 24.14 kg/m. If this is out of the aforementioned range listed, please consider follow up with your Primary Care Provider.  If you are age 90 or younger, your body mass index should be between 19-25. Your Body mass index is 24.14 kg/m. If this is out of the aformentioned range listed, please consider follow up with your Primary Care Provider.    OVER THE COUNTER MEDICATION  Please purchase the following medications over the counter and take as directed:       Please continue omeprazole 20 MG twice a day.   MEDICATION  We have sent the following medication to your pharmacy for you to pick up at your convenience:     Carafate 1 gm Tablet. Take 1 tablet twice a day for 10 days. Do not take within 2-4 hours of other             medications.  We have scheduled you a follow up with Dr. Lavon Paganini on 08/12/20 at 9:30.  ULTRASOUND  You have been scheduled for an abdominal ultrasound at [facility] (1st floor of hospital) on 07/13/20 at 8:00 AM.   Please arrive 15 minutes prior to your appointment for registration.  Make certain not to have anything to eat or drink 6 hours prior to your appointment.  Should you need to reschedule your appointment, please contact radiology at [phone]. This test typically takes about 30 minutes to perform.   LABS: Your provider has requested that you go to the basement level for lab work before leaving today. Press "B" on the elevator. The lab is located at the first door on the left as you exit the elevator.  HEALTHCARE LAWS AND MY CHART RESULTS: Due to recent changes in healthcare laws, you may see the results of your imaging and laboratory studies on MyChart before your provider has had a chance to review them.  We understand that in some cases there may be results that are confusing or concerning to you. Not all laboratory results come back in the  same time frame and the provider may be waiting for multiple results in order to interpret others.  Please give [place] 48 hours in order for your provider to thoroughly review all the results before contacting the office for clarification of your results.   Please Avoid all NSAID's. Some examples of NSAID's are as follows: Aspirin (Bufferin, Bayer, and Excedrin) Ibuprofen (Advil, Motrin, Nuprin) Ketoprofen (Actron, Orudis) Naproxen (Aleve) Daypro  Indocin  Lodine  Naprosyn  Relafen  Vimovo Voltaren  Please contact your primary care provider regarding a referral to ENT.

## 2020-07-09 ENCOUNTER — Ambulatory Visit: Payer: Self-pay | Admitting: Nurse Practitioner

## 2020-07-09 LAB — IRON,TIBC AND FERRITIN PANEL
%SAT: 40 % (calc) (ref 16–45)
Ferritin: 20 ng/mL (ref 16–232)
Iron: 160 ug/dL (ref 45–160)
TIBC: 400 mcg/dL (calc) (ref 250–450)

## 2020-07-13 ENCOUNTER — Other Ambulatory Visit: Payer: Self-pay

## 2020-07-13 ENCOUNTER — Telehealth: Payer: Self-pay | Admitting: Nurse Practitioner

## 2020-07-13 ENCOUNTER — Ambulatory Visit (HOSPITAL_COMMUNITY)
Admission: RE | Admit: 2020-07-13 | Discharge: 2020-07-13 | Disposition: A | Discharge status: Home / Self Care | Payer: BC Managed Care – PPO | Source: Ambulatory Visit | Attending: Nurse Practitioner | Admitting: Nurse Practitioner

## 2020-07-13 ENCOUNTER — Ambulatory Visit (HOSPITAL_COMMUNITY): Payer: BC Managed Care – PPO

## 2020-07-13 DIAGNOSIS — R1013 Epigastric pain: Secondary | ICD-10-CM | POA: Insufficient documentation

## 2020-07-13 DIAGNOSIS — K219 Gastro-esophageal reflux disease without esophagitis: Secondary | ICD-10-CM | POA: Insufficient documentation

## 2020-07-13 NOTE — Telephone Encounter (Signed)
Please advise 

## 2020-07-13 NOTE — Telephone Encounter (Signed)
Pt is requesting a 14 day supply of DIFLUCAN.  [facility]

## 2020-07-14 ENCOUNTER — Ambulatory Visit
Admission: RE | Admit: 2020-07-14 | Discharge: 2020-07-14 | Disposition: A | Discharge status: Home / Self Care | Payer: BC Managed Care – PPO | Source: Ambulatory Visit | Attending: Orthopedic Surgery | Admitting: Orthopedic Surgery

## 2020-07-14 DIAGNOSIS — M4327 Fusion of spine, lumbosacral region: Secondary | ICD-10-CM

## 2020-07-14 DIAGNOSIS — M4324 Fusion of spine, thoracic region: Secondary | ICD-10-CM

## 2020-07-14 DIAGNOSIS — Z4789 Encounter for other orthopedic aftercare: Secondary | ICD-10-CM

## 2020-07-14 DIAGNOSIS — M4326 Fusion of spine, lumbar region: Secondary | ICD-10-CM

## 2020-07-14 DIAGNOSIS — M4013 Other secondary kyphosis, cervicothoracic region: Secondary | ICD-10-CM

## 2020-07-14 NOTE — Telephone Encounter (Signed)
Pt is checking on the status of the msg.

## 2020-07-14 NOTE — Telephone Encounter (Signed)
Patient called again stating she will be going out of town by this afternoon and is wanting medication before she leaves please.

## 2020-07-16 NOTE — Telephone Encounter (Signed)
Dr. Lavon Paganini please refer to 07/08/2020 office visit note.  The patient achieved a prescription for Diflucan for 1 week as prescribed by her PCP for throat clearing symptoms and epigastric discomfort.  The symptoms somewhat improved however, she told me she developed bilateral leg and hip pain with chills after taking the Diflucan for 3 to 4 days which resolved.  I am not comfortable represcribing this medication with this reported adverse side effect for I am forwarding this message to you.  The patient stated her husband is a Teacher, early years/pre and these were normal side effects.

## 2020-07-17 NOTE — Telephone Encounter (Signed)
Please schedule for EGD for evaluation to check if she has candida esophagitis or some other etiology prior to treating it empirically given the side effects. Thanks

## 2020-07-17 NOTE — Telephone Encounter (Signed)
Thank you :)

## 2020-07-17 NOTE — Telephone Encounter (Signed)
Spoke to patient this morning to schedule EGD for possible candida esophagitis. Patient was  requesting to add on a colonoscopy as she has only had a cologuard test done a few years ago. Dr Lavon Paganini agreed to do both at the same time as long as patient's symptoms were under control (next available ECL 09-09-20). Patient reports her symptoms are not that bad and feels she can wait until March. She will call the office before her follow up appointment late February if symptoms worsen. All questions answered. Patient voiced understanding.

## 2020-07-17 NOTE — Telephone Encounter (Signed)
Kelly, pls contact the patient and let her know Dr. Lavon Paganini reviewed her recent office consult and RX request, Diflucan not recommended.  Dr. Lavon Paganini advised the patient to schedule an EGD to further evaluate. Pls schedule patient for an EGD with Dr. Lavon Paganini. Thank you.

## 2020-07-21 ENCOUNTER — Other Ambulatory Visit: Payer: Self-pay

## 2020-08-12 ENCOUNTER — Encounter: Payer: Self-pay | Admitting: Gastroenterology

## 2020-08-12 ENCOUNTER — Ambulatory Visit (INDEPENDENT_AMBULATORY_CARE_PROVIDER_SITE_OTHER): Payer: BC Managed Care – PPO | Admitting: Gastroenterology

## 2020-08-12 VITALS — BP 126/72 | HR 67 | Ht 62.0 in | Wt 136.0 lb

## 2020-08-12 DIAGNOSIS — B3781 Candidal esophagitis: Secondary | ICD-10-CM | POA: Diagnosis not present

## 2020-08-12 DIAGNOSIS — Z1211 Encounter for screening for malignant neoplasm of colon: Secondary | ICD-10-CM

## 2020-08-12 DIAGNOSIS — R198 Other specified symptoms and signs involving the digestive system and abdomen: Secondary | ICD-10-CM | POA: Diagnosis not present

## 2020-08-12 DIAGNOSIS — R1013 Epigastric pain: Secondary | ICD-10-CM | POA: Diagnosis not present

## 2020-08-12 DIAGNOSIS — R0989 Other specified symptoms and signs involving the circulatory and respiratory systems: Secondary | ICD-10-CM

## 2020-08-12 MED ORDER — SUCRALFATE 1 G PO TABS: 1.0000 g | ORAL_TABLET | Freq: Three times a day (TID) | ORAL | 2 refills | Status: DC

## 2020-08-12 NOTE — Progress Notes (Signed)
Tracy Ross    [id number]    [birth year]  Primary Care Physician:Meyers, Jeannett Senior, MD  Referring Physician: Joycelyn Rua, MD [address] [place],  [place] [address]   Chief complaint:  Epigastric pain, globus sensation  HPI:  55 year old very pleasant female here for follow-up visit for chronic GERD and globus sensation.  She has significantly changed her diet, is avoiding sugar, sweets and red wine.  She failed irritation in the throat and globus sensation improved after using Diflucan.  She is planning to go through C-spine surgery and is worried if she would have recurrent Candida esophagitis.  Denies any dysphagia, decreased appetite, vomiting, melena or rectal bleeding.  EGD 10/24/19: - LA Grade B (one or more mucosal breaks greater than 5 mm, not extending between the tops of two mucosal folds) esophagitis was found 32 to 35 cm from the incisors. Biopsies were taken with a cold forceps for histology. - The gastroesophageal flap valve was visualized endoscopically and classified as Hill Grade II (fold present, opens with respiration). - Patchy mild inflammation characterized by congestion (edema) and erythema was found in the cardia, in the gastric antrum and in the prepyloric region of the stomach. Biopsies were taken with a cold forceps for Helicobacter pylori testing. - The examined duodenum was normal.  Outpatient Encounter Medications as of 08/12/2020  Medication Sig  . BIOTIN PO Take 1 tablet by mouth daily.  Marland Kitchen buPROPion (WELLBUTRIN XL) 300 MG 24 hr tablet Take 300 mg by mouth daily.   . Cholecalciferol (VITAMIN D3) 1.25 MG (50000 UT) CAPS Take by mouth.  . EPINEPHrine 0.3 mg/0.3 mL IJ SOAJ injection Inject 0.3 mg into the muscle once.  . Multiple Vitamin (MULTIVITAMIN WITH MINERALS) TABS tablet Take 1 tablet by mouth daily.  . Omega-3 Fatty Acids (FISH OIL) 1000 MG CAPS Take 1 capsule by mouth daily.  . progesterone (PROMETRIUM) 100 MG  capsule progesterone micronized 100 mg capsule  TAKE 1 CAPSULE BY MOUTH EVERY DAY AT NIGHT  . thyroid (ARMOUR) 90 MG tablet Take by mouth.  . Turmeric 500 MG CAPS Take by mouth.  . zolpidem (AMBIEN) 5 MG tablet zolpidem 5 mg tablet  TAKE 1 TABLET BY MOUTH AT BEDTIME AS NEEDED  . [DISCONTINUED] sucralfate (CARAFATE) 1 g tablet Take 1 tablet (1 g total) by mouth 2 (two) times daily.  . [DISCONTINUED] fluconazole (DIFLUCAN) 100 MG tablet Take 1 tablet (100 mg total) by mouth daily.   No facility-administered encounter medications on file as of 08/12/2020.    Allergies as of 08/12/2020 - Review Complete 08/12/2020  Allergen Reaction Noted  . Bee venom Anaphylaxis and Hives 12/26/2012  . Nitrofurantoin macrocrystal Nausea And Vomiting 08/08/2016    Past Medical History:  Diagnosis Date  . Allergy   . Arthritis    back and hips  . Asthma    allergy driven, reactive airway  . Complication of anesthesia    Difficult to awake after having wisdom teeth extracted approx 30 years ago  . Family history of adverse reaction to anesthesia    Pt stated "my sisters are very sensitive to anesthesia and it does not take a whole lot to put them to sleep"  . Thyroid disease    hypothyroid    Past Surgical History:  Procedure Laterality Date  . SPINAL FUSION  08/2019  . SPINE SURGERY     t-3 to pelvis   . TOTAL HIP ARTHROPLASTY Right  06/10/2014   Procedure: RIGHT TOTAL HIP ARTHROPLASTY ANTERIOR APPROACH;  Surgeon: Kathryne Hitch, MD;  Location: [facility] OR;  Service: Orthopedics;  Laterality: Right;  . TRACHEOSTOMY     as a child  . WISDOM TOOTH EXTRACTION      Family History  Problem Relation Age of Onset  . Diabetes Mother   . Hypertension Mother   . Heart attack Father   . Breast cancer Neg Hx   . Colon cancer Neg Hx   . Stomach cancer Neg Hx   . Pancreatic cancer Neg Hx   . Esophageal cancer Neg Hx   . Rectal cancer Neg Hx     Social History   Socioeconomic History  .  Marital status: Married    Spouse name: Not on file  . Number of children: Not on file  . Years of education: Not on file  . Highest education level: Not on file  Occupational History  . Not on file  Tobacco Use  . Smoking status: Never Smoker  . Smokeless tobacco: Never Used  Vaping Use  . Vaping Use: Never used  Substance and Sexual Activity  . Alcohol use: Yes    Comment: socially-not currently drinking  . Drug use: No  . Sexual activity: Not on file  Other Topics Concern  . Not on file  Social History Narrative  . Not on file   Social Determinants of Health   Financial Resource Strain: Not on file  Food Insecurity: Not on file  Transportation Needs: Not on file  Physical Activity: Not on file  Stress: Not on file  Social Connections: Not on file  Intimate Partner Violence: Not on file      Review of systems: All other review of systems negative except as mentioned in the HPI.   Physical Exam: Vitals:   08/12/20 1002  BP: 126/72  Pulse: 67   Body mass index is 24.87 kg/m. Gen:      No acute distress HEENT:  sclera anicteric Ext:    No edema Neuro: alert and oriented x 3 Psych: normal mood and affect  Data Reviewed:  Reviewed labs, radiology imaging, old records and pertinent past GI work up   Assessment and Plan/Recommendations:  55 year old very pleasant female with history of chronic GERD symptoms, globus sensation and Candida esophagitis Overall her symptoms have significantly improved with dietary changes Use clotrimazole lozenges up to 4 times daily as needed for recurrent oral thrush  She is hesitant to take long-term acid suppressive therapy.  We will plan to repeat EGD to document healing of erosive esophagitis and to evaluate if she has significant gastroesophageal acid reflux with 48-hour pH Bravo placement. She is due for colorectal cancer screening, will schedule colonoscopy along with EGD. Advised patient to call back once she recovers  from the C-spine surgery.  The risks and benefits as well as alternatives of endoscopic procedure(s) have been discussed and reviewed. All questions answered. The patient agrees to proceed.  The patient was provided an opportunity to ask questions and all were answered. The patient agreed with the plan and demonstrated an understanding of the instructions.  Iona Beard , MD    CC: Joycelyn Rua, MD

## 2020-08-12 NOTE — Patient Instructions (Signed)
We have cancelled your current Previsit and Procedures as requested  We will put in a recall for your to have your Colonoscopy/Endoscopy/Bravo in 3 months  Use OTC Clotrimazole lozenges up to four times daily as needed   Conn's Current Therapy 2021 (pp. 213-216). [place], [place]: Elsevier.">  Gastroesophageal Reflux Disease, Adult Gastroesophageal reflux (GER) happens when acid from the stomach flows up into the tube that connects the mouth and the stomach (esophagus). Normally, food travels down the esophagus and stays in the stomach to be digested. However, when a person has GER, food and stomach acid sometimes move back up into the esophagus. If this becomes a more serious problem, the person may be diagnosed with a disease called gastroesophageal reflux disease (GERD). GERD occurs when the reflux:  Happens often.  Causes frequent or severe symptoms.  Causes problems such as damage to the esophagus. When stomach acid comes in contact with the esophagus, the acid may cause inflammation in the esophagus. Over time, GERD may create small holes (ulcers) in the lining of the esophagus. What are the causes? This condition is caused by a problem with the muscle between the esophagus and the stomach (lower esophageal sphincter, or LES). Normally, the LES muscle closes after food passes through the esophagus to the stomach. When the LES is weakened or abnormal, it does not close properly, and that allows food and stomach acid to go back up into the esophagus. The LES can be weakened by certain dietary substances, medicines, and medical conditions, including:  Tobacco use.  Pregnancy.  Having a hiatal hernia.  Alcohol use.  Certain foods and beverages, such as coffee, chocolate, onions, and peppermint. What increases the risk? You are more likely to develop this condition if you:  Have an increased body weight.  Have a connective tissue disorder.  Take NSAIDs, such as ibuprofen. What  are the signs or symptoms? Symptoms of this condition include:  Heartburn.  Difficult or painful swallowing and the feeling of having a lump in the throat.  A bitter taste in the mouth.  Bad breath and having a large amount of saliva.  Having an upset or bloated stomach and belching.  Chest pain. Different conditions can cause chest pain. Make sure you see your health care provider if you experience chest pain.  Shortness of breath or wheezing.  Ongoing (chronic) cough or a nighttime cough.  Wearing away of tooth enamel.  Weight loss. How is this diagnosed? This condition may be diagnosed based on a medical history and a physical exam. To determine if you have mild or severe GERD, your health care provider may also monitor how you respond to treatment. You may also have tests, including:  A test to examine your stomach and esophagus with a small camera (endoscopy).  A test that measures the acidity level in your esophagus.  A test that measures how much pressure is on your esophagus.  A barium swallow or modified barium swallow test to show the shape, size, and functioning of your esophagus. How is this treated? Treatment for this condition may vary depending on how severe your symptoms are. Your health care provider may recommend:  Changes to your diet.  Medicine.  Surgery. The goal of treatment is to help relieve your symptoms and to prevent complications. Follow these instructions at home: Eating and drinking  Follow a diet as recommended by your health care provider. This may involve avoiding foods and drinks such as: ? Coffee and tea, with or without caffeine. ?  Drinks that contain alcohol. ? Energy drinks and sports drinks. ? Carbonated drinks or sodas. ? Chocolate and cocoa. ? Peppermint and mint flavorings. ? Garlic and onions. ? Horseradish. ? Spicy and acidic foods, including peppers, chili powder, curry powder, vinegar, hot sauces, and barbecue  sauce. ? Citrus fruit juices and citrus fruits, such as oranges, lemons, and limes. ? Tomato-based foods, such as red sauce, chili, salsa, and pizza with red sauce. ? Fried and fatty foods, such as donuts, french fries, potato chips, and high-fat dressings. ? High-fat meats, such as hot dogs and fatty cuts of red and white meats, such as rib eye steak, sausage, ham, and bacon. ? High-fat dairy items, such as whole milk, butter, and cream cheese.  Eat small, frequent meals instead of large meals.  Avoid drinking large amounts of liquid with your meals.  Avoid eating meals during the 2-3 hours before bedtime.  Avoid lying down right after you eat.  Do not exercise right after you eat.   Lifestyle  Do not use any products that contain nicotine or tobacco. These products include cigarettes, chewing tobacco, and vaping devices, such as e-cigarettes. If you need help quitting, ask your health care provider.  Try to reduce your stress by using methods such as yoga or meditation. If you need help reducing stress, ask your health care provider.  If you are overweight, reduce your weight to an amount that is healthy for you. Ask your health care provider for guidance about a safe weight loss goal.   General instructions  Pay attention to any changes in your symptoms.  Take over-the-counter and prescription medicines only as told by your health care provider. Do not take aspirin, ibuprofen, or other NSAIDs unless your health care provider told you to take these medicines.  Wear loose-fitting clothing. Do not wear anything tight around your waist that causes pressure on your abdomen.  Raise (elevate) the head of your bed about 6 inches (15 cm). You can use a wedge to do this.  Avoid bending over if this makes your symptoms worse.  Keep all follow-up visits. This is important. Contact a health care provider if:  You have: ? New symptoms. ? Unexplained weight loss. ? Difficulty swallowing  or it hurts to swallow. ? Wheezing or a persistent cough. ? A hoarse voice.  Your symptoms do not improve with treatment. Get help right away if:  You have sudden pain in your arms, neck, jaw, teeth, or back.  You suddenly feel sweaty, dizzy, or light-headed.  You have chest pain or shortness of breath.  You vomit and the vomit is green, yellow, or black, or it looks like blood or coffee grounds.  You faint.  You have stool that is red, bloody, or black.  You cannot swallow, drink, or eat. These symptoms may represent a serious problem that is an emergency. Do not wait to see if the symptoms will go away. Get medical help right away. Call your local emergency services (911 in the [place] Do not drive yourself to the hospital. Summary  Gastroesophageal reflux happens when acid from the stomach flows up into the esophagus. GERD is a disease in which the reflux happens often, causes frequent or severe symptoms, or causes problems such as damage to the esophagus.  Treatment for this condition may vary depending on how severe your symptoms are. Your health care provider may recommend diet and lifestyle changes, medicine, or surgery.  Contact a health care provider if you have new  or worsening symptoms.  Take over-the-counter and prescription medicines only as told by your health care provider. Do not take aspirin, ibuprofen, or other NSAIDs unless your health care provider told you to do so.  Keep all follow-up visits as told by your health care provider. This is important. This information is not intended to replace advice given to you by your health care provider. Make sure you discuss any questions you have with your health care provider. Document Revised: 12/16/2019 Document Reviewed: 12/16/2019 Elsevier Patient Education  2021 [employer].   Due to recent changes in healthcare laws, you may see the results of your imaging and laboratory studies on MyChart before your provider  has had a chance to review them.  We understand that in some cases there may be results that are confusing or concerning to you. Not all laboratory results come back in the same time frame and the provider may be waiting for multiple results in order to interpret others.  Please give [place] 48 hours in order for your provider to thoroughly review all the results before contacting the office for clarification of your results.   Thank you for choosing [facility]  Philbert Riser Nandigam,MD

## 2020-08-17 NOTE — Progress Notes (Signed)
Reviewed and agree with documentation and assessment and plan. K. Veena Nandigam , MD   

## 2020-09-01 ENCOUNTER — Encounter: Payer: Self-pay | Admitting: Gastroenterology

## 2020-09-09 ENCOUNTER — Encounter: Payer: BC Managed Care – PPO | Admitting: Gastroenterology

## 2020-09-14 ENCOUNTER — Other Ambulatory Visit: Payer: Self-pay | Admitting: Orthopedic Surgery

## 2020-09-14 DIAGNOSIS — Z4789 Encounter for other orthopedic aftercare: Secondary | ICD-10-CM

## 2020-09-14 DIAGNOSIS — M4324 Fusion of spine, thoracic region: Secondary | ICD-10-CM

## 2020-09-15 ENCOUNTER — Other Ambulatory Visit: Payer: Self-pay

## 2020-09-15 ENCOUNTER — Ambulatory Visit
Admission: RE | Admit: 2020-09-15 | Discharge: 2020-09-15 | Disposition: A | Discharge status: Home / Self Care | Payer: BC Managed Care – PPO | Source: Ambulatory Visit | Attending: Orthopedic Surgery | Admitting: Orthopedic Surgery

## 2020-09-15 DIAGNOSIS — Z4789 Encounter for other orthopedic aftercare: Secondary | ICD-10-CM

## 2020-09-15 DIAGNOSIS — M4324 Fusion of spine, thoracic region: Secondary | ICD-10-CM

## 2020-10-13 ENCOUNTER — Encounter (HOSPITAL_BASED_OUTPATIENT_CLINIC_OR_DEPARTMENT_OTHER): Payer: Self-pay | Admitting: Emergency Medicine

## 2020-10-13 ENCOUNTER — Emergency Department (HOSPITAL_BASED_OUTPATIENT_CLINIC_OR_DEPARTMENT_OTHER)
Admission: EM | Admit: 2020-10-13 | Discharge: 2020-10-13 | Disposition: A | Discharge status: Home / Self Care | Payer: BC Managed Care – PPO | Attending: Emergency Medicine | Admitting: Emergency Medicine

## 2020-10-13 ENCOUNTER — Other Ambulatory Visit (HOSPITAL_BASED_OUTPATIENT_CLINIC_OR_DEPARTMENT_OTHER): Payer: Self-pay

## 2020-10-13 ENCOUNTER — Other Ambulatory Visit: Payer: Self-pay

## 2020-10-13 DIAGNOSIS — J45909 Unspecified asthma, uncomplicated: Secondary | ICD-10-CM | POA: Insufficient documentation

## 2020-10-13 DIAGNOSIS — Z96641 Presence of right artificial hip joint: Secondary | ICD-10-CM | POA: Diagnosis not present

## 2020-10-13 DIAGNOSIS — Z79899 Other long term (current) drug therapy: Secondary | ICD-10-CM | POA: Diagnosis not present

## 2020-10-13 DIAGNOSIS — E039 Hypothyroidism, unspecified: Secondary | ICD-10-CM | POA: Diagnosis not present

## 2020-10-13 DIAGNOSIS — T8149XA Infection following a procedure, other surgical site, initial encounter: Secondary | ICD-10-CM

## 2020-10-13 DIAGNOSIS — R5383 Other fatigue: Secondary | ICD-10-CM | POA: Diagnosis not present

## 2020-10-13 LAB — CBC WITH DIFFERENTIAL/PLATELET
Abs Immature Granulocytes: 0.05 10*3/uL (ref 0.00–0.07)
Basophils Absolute: 0 10*3/uL (ref 0.0–0.1)
Basophils Relative: 0 %
Eosinophils Absolute: 0.1 10*3/uL (ref 0.0–0.5)
Eosinophils Relative: 1 %
HCT: 32.9 % — ABNORMAL LOW (ref 36.0–46.0)
Hemoglobin: 11 g/dL — ABNORMAL LOW (ref 12.0–15.0)
Immature Granulocytes: 0 %
Lymphocytes Relative: 4 %
Lymphs Abs: 0.5 10*3/uL — ABNORMAL LOW (ref 0.7–4.0)
MCH: 33 pg (ref 26.0–34.0)
MCHC: 33.4 g/dL (ref 30.0–36.0)
MCV: 98.8 fL (ref 80.0–100.0)
Monocytes Absolute: 0.6 10*3/uL (ref 0.1–1.0)
Monocytes Relative: 4 %
Neutro Abs: 12.1 10*3/uL — ABNORMAL HIGH (ref 1.7–7.7)
Neutrophils Relative %: 91 %
Platelets: 372 10*3/uL (ref 150–400)
RBC: 3.33 MIL/uL — ABNORMAL LOW (ref 3.87–5.11)
RDW: 13.6 % (ref 11.5–15.5)
WBC: 13.4 10*3/uL — ABNORMAL HIGH (ref 4.0–10.5)
nRBC: 0 % (ref 0.0–0.2)

## 2020-10-13 LAB — BASIC METABOLIC PANEL
Anion gap: 9 (ref 5–15)
BUN: 13 mg/dL (ref 6–20)
CO2: 24 mmol/L (ref 22–32)
Calcium: 9.1 mg/dL (ref 8.9–10.3)
Chloride: 106 mmol/L (ref 98–111)
Creatinine, Ser: 0.68 mg/dL (ref 0.44–1.00)
GFR, Estimated: >60 mL/min (ref >60)
Glucose, Bld: 102 mg/dL — ABNORMAL HIGH (ref 70–99)
Potassium: 3.7 mmol/L (ref 3.5–5.1)
Sodium: 139 mmol/L (ref 135–145)

## 2020-10-13 LAB — LACTIC ACID, PLASMA: Lactic Acid, Venous: 0.7 mmol/L (ref 0.5–1.9)

## 2020-10-13 LAB — SEDIMENTATION RATE: Sed Rate: 58 mm/hr — ABNORMAL HIGH (ref 0–22)

## 2020-10-13 LAB — C-REACTIVE PROTEIN: CRP: 26.2 mg/dL — ABNORMAL HIGH (ref <1.0)

## 2020-10-13 NOTE — ED Triage Notes (Addendum)
09/21/20 last surgery on back and now  Her incision is red and they thinks she may have infection, to see [facility] today at 2 , surgery was at [facility], sent here for  Labs supposed,  Feels nauseated

## 2020-10-13 NOTE — Discharge Instructions (Addendum)
Follow-up with Dr. Anda Latina office at 2:00 as planned.

## 2020-10-13 NOTE — ED Provider Notes (Signed)
[facility] EMERGENCY DEPT Provider Note   CSN: [id number] Arrival date & time: 10/13/20  4235     History Chief Complaint  Patient presents with  . Back Pain    Tracy Ross is a 55 y.o. female.  HPI Patient presents after being sent in by her neurosurgeon for evaluation of possible wound infection.  On April 4 she had a redo scoliosis surgery.  Has had some redness of the wound since but around a week ago became more red.  A couple days ago became more fatigued.  States wound is more red with more swelling around it now.  She has an appointment at 2:00 today with neurosurgery was told to come in to get a CBC with differential sed rate and CRP.  Potentially would require surgery later today depending on findings.  Potentially also could require admission to the hospital for IV antibiotics.  Patient states she is doing well except for the fatigue now.  Has not had fevers.  [place] and is improving overall.  States there is been some drainage from the wound.    Past Medical History:  Diagnosis Date  . Allergy   . Arthritis    back and hips  . Asthma    allergy driven, reactive airway  . Complication of anesthesia    Difficult to awake after having wisdom teeth extracted approx 30 years ago  . Family history of adverse reaction to anesthesia    Pt stated "my sisters are very sensitive to anesthesia and it does not take a whole lot to put them to sleep"  . Thyroid disease    hypothyroid    Patient Active Problem List   Diagnosis Date Noted  . Arthritis of right hip 06/10/2014  . 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 Primary osteoarthritis of right hip 03/25/2014  . Retrolisthesis of vertebrae 03/25/2014  . Hip pain, chronic 12/27/2012  . Right Paralabral cyst of hip 06/21/2012  . Right L3/L4 lumbar radiculitis 05/24/2012    Past Surgical History:  Procedure Laterality Date  .  BACK SURGERY    . SPINAL FUSION  08/2019  . SPINE SURGERY     t-3 to pelvis   . TOTAL HIP ARTHROPLASTY Right 06/10/2014   Procedure: RIGHT TOTAL HIP ARTHROPLASTY ANTERIOR APPROACH;  Surgeon: Kathryne Hitch, MD;  Location: [facility] OR;  Service: Orthopedics;  Laterality: Right;  . TRACHEOSTOMY     as a child  . WISDOM TOOTH EXTRACTION       OB History   No obstetric history on file.     Family History  Problem Relation Age of Onset  . Diabetes Mother   . Hypertension Mother   . Heart attack Father   . Breast cancer Neg Hx   . Colon cancer Neg Hx   . Stomach cancer Neg Hx   . Pancreatic cancer Neg Hx   . Esophageal cancer Neg Hx   . Rectal cancer Neg Hx     Social History   Tobacco Use  . Smoking status: Never Smoker  . Smokeless tobacco: Never Used  Vaping Use  . Vaping Use: Never used  Substance Use Topics  . Alcohol use: Yes    Comment: socially-not currently drinking  . Drug use: No    Home Medications Prior to Admission medications   Medication Sig Start Date End Date Taking? Authorizing Provider  BIOTIN PO Take  1 tablet by mouth daily.    [provider]  buPROPion (WELLBUTRIN XL) 300 MG 24 hr tablet Take 300 mg by mouth daily.  11/27/13   [provider]  Cholecalciferol (VITAMIN D3) 1.25 MG (50000 UT) CAPS Take by mouth.    [provider]  EPINEPHrine 0.3 mg/0.3 mL IJ SOAJ injection Inject 0.3 mg into the muscle once.    [provider]  Multiple Vitamin (MULTIVITAMIN WITH MINERALS) TABS tablet Take 1 tablet by mouth daily.    [provider]  Omega-3 Fatty Acids (FISH OIL) 1000 MG CAPS Take 1 capsule by mouth daily.    [provider]  progesterone (PROMETRIUM) 100 MG capsule progesterone micronized 100 mg capsule  TAKE 1 CAPSULE BY MOUTH EVERY DAY AT NIGHT    [provider]  sucralfate (CARAFATE) 1 g tablet Take 1 tablet (1 g total) by mouth 4 (four) times daily -  with meals and at bedtime.  08/12/20   Napoleon Form, MD  thyroid (ARMOUR) 90 MG tablet Take by mouth.    [provider]  Turmeric 500 MG CAPS Take by mouth.    [provider]  zolpidem (AMBIEN) 5 MG tablet zolpidem 5 mg tablet  TAKE 1 TABLET BY MOUTH AT BEDTIME AS NEEDED 08/04/16   [provider]    Allergies    Bee venom and Nitrofurantoin macrocrystal  Review of Systems   Review of Systems  Constitutional: Positive for fatigue. Negative for fever.  HENT: Negative for congestion.   Cardiovascular: Negative for chest pain.  Gastrointestinal: Negative for abdominal pain.  Genitourinary: Negative for enuresis.  Musculoskeletal: Positive for back pain. Negative for neck pain.  Skin: Positive for wound.  Hematological: Does not bruise/bleed easily.  Psychiatric/Behavioral: Negative for confusion.    Physical Exam Updated Vital Signs BP 109/67 (BP Location: Right Arm)   Pulse 73   Temp 98.7 F (37.1 C) (Oral)   Resp (!) 22   Ht 5\' 3"  (1.6 m)   Wt 58.1 kg   LMP  (LMP Unknown)   SpO2 99%   BMI 22.67 kg/m   Physical Exam Vitals and nursing note reviewed.  HENT:     Head: Atraumatic.  Eyes:     Pupils: Pupils are equal, round, and reactive to light.  Cardiovascular:     Rate and Rhythm: Regular rhythm.  Pulmonary:     Breath sounds: No wheezing or rhonchi.  Abdominal:     Tenderness: There is no abdominal tenderness.  Musculoskeletal:     Cervical back: Neck supple.     Right lower leg: No edema.     Left lower leg: No edema.  Skin:    Capillary Refill: Capillary refill takes less than 2 seconds.     Comments: Midline scar down upper back.  There is erythema and in the central aspect of it is some blistering.  Some serous drainage.  No purulent drainage.  Neurological:     Mental Status: She is alert and oriented to person, place, and time.       ED Results / Procedures / Treatments   Labs (all labs ordered are listed, but only abnormal results are  displayed) Labs Reviewed  CBC WITH DIFFERENTIAL/PLATELET - Abnormal; Notable for the following components:      Result Value   WBC 13.4 (*)    RBC 3.33 (*)    Hemoglobin 11.0 (*)    HCT 32.9 (*)    Neutro Abs 12.1 (*)  Lymphs Abs 0.5 (*)    All other components within normal limits  BASIC METABOLIC PANEL - Abnormal; Notable for the following components:   Glucose, Bld 102 (*)    All other components within normal limits  SEDIMENTATION RATE - Abnormal; Notable for the following components:   Sed Rate 58 (*)    All other components within normal limits  LACTIC ACID, PLASMA  C-REACTIVE PROTEIN    EKG None  Radiology No results found.  Procedures Procedures   Medications Ordered in ED Medications - No data to display  ED Course  I have reviewed the triage vital signs and the nursing notes.  Pertinent labs & imaging results that were available during my care of the patient were reviewed by me and considered in my medical decision making (see chart for details).    MDM Rules/Calculators/A&P                         Patient with redness and swelling of wound on back post surgery.  Surgery around 3 weeks ago.  White count mildly elevated.  Sed rate mildly elevated.  Had been sent in for evaluation by neurosurgery with plans of following up in the office at 2:00 today.  Discussed with PA at the office.  They think she is stable for transfer and does not need direct admission to the hospital.  Sed rate still pending.  Discharged with plan of following up.  Appears to be a cellulitis of the wound Final Clinical Impression(s) / ED Diagnoses Final diagnoses:  Wound infection after surgery    Rx / DC Orders ED Discharge Orders    None       Benjiman Core, MD 10/13/20 1519

## 2020-11-06 ENCOUNTER — Telehealth: Payer: Self-pay | Admitting: Gastroenterology

## 2020-11-06 MED ORDER — FLUCONAZOLE 100 MG PO TABS: 100.0000 mg | ORAL_TABLET | Freq: Every day | ORAL | 0 refills | Status: DC

## 2020-11-06 NOTE — Telephone Encounter (Signed)
Left this information on the patient's voicemail.

## 2020-11-06 NOTE — Telephone Encounter (Signed)
Called the patient back. No answer. Left a voicemail of returned call.

## 2020-11-06 NOTE — Telephone Encounter (Signed)
Patient called states she has had a yeast infection in her stomach and esophagus before. Recently had a procedure and developed an infection while having a procedure over at [facility]. She said she has been having chronic abdominal pain again and thinks the yeast is back. She is seeking to get back on antibiotics and see if she can have an EGD procedure to find this yeast infection somehow. Please call to discuss further.

## 2020-11-06 NOTE — Telephone Encounter (Signed)
Will need to have clearance from spine surgeon prior to EGD and colonoscopy. Please schedule office follow up visit next available with me or APP.  I have sent Rx for Diflucan 100mg  daily X 10 days.

## 2020-11-06 NOTE — Telephone Encounter (Signed)
Review the patient's Care Everywhere for a complete up date of her recent surgeries.  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 She is on antibiotics via her PIC line.  She has developed epigastric discomfort (at 2-3 on the scale) and is coughing up a white mucous. States "just like when I had the yeast infection before." She has spoke with her provider at [facility]and was encouraged to contact us. She sees Cindra Presume [phone] Fax [phone] Is she okay for Diflucan? When should she return to [place] or have her endoscopies scheduled?

## 2020-12-15 ENCOUNTER — Other Ambulatory Visit: Payer: Self-pay | Admitting: Obstetrics and Gynecology

## 2020-12-24 ENCOUNTER — Telehealth: Payer: Self-pay

## 2020-12-24 ENCOUNTER — Other Ambulatory Visit: Payer: Self-pay

## 2020-12-24 MED ORDER — FLUCONAZOLE 100 MG PO TABS: ORAL_TABLET | ORAL | 0 refills | Status: DC

## 2020-12-24 NOTE — Telephone Encounter (Signed)
Okay, if symptoms are consistent with prior candidiasis symptoms and she responded to Diflucan in the past we can repeat her course of that.  I recommend Diflucan 400 mg x 1 dose, followed by 200 mg/day for another 13 days.  If symptoms persist despite this she should contact us.  Thanks

## 2020-12-24 NOTE — Telephone Encounter (Signed)
DOD Patient of Dr Lavon Paganini  Patient calls with concerns that the "yeast in my throat and stomach are back." She has been put back on antibiotics and will complete this on 12/27/20. She tells me she has developed stomach pain that bothers her regardless of eating or not. She has a sensation of something in her throat and can feel solids traveling down when she swallows. Some things trigger a cough when she swallows. She would like to repeat Diflucan. Last time she took it was in May. States it resolved these symptoms. She feels certain she has the same issue.  Her appointment in office is 02/04/21. She will see the ENT for the consult a few days after the visit here.

## 2020-12-24 NOTE — Telephone Encounter (Signed)
Called the patient to advise of the plan. No answer. Details left on her voicemail. Rx to the [facility] in [place], [place] as she had requested.

## 2021-01-07 ENCOUNTER — Encounter: Payer: Self-pay | Admitting: Physician Assistant

## 2021-01-07 ENCOUNTER — Ambulatory Visit (INDEPENDENT_AMBULATORY_CARE_PROVIDER_SITE_OTHER): Payer: BC Managed Care – PPO | Admitting: Physician Assistant

## 2021-01-07 VITALS — BP 118/80 | HR 60 | Ht 63.0 in | Wt 122.6 lb

## 2021-01-07 DIAGNOSIS — B3781 Candidal esophagitis: Secondary | ICD-10-CM

## 2021-01-07 DIAGNOSIS — R1013 Epigastric pain: Secondary | ICD-10-CM

## 2021-01-07 DIAGNOSIS — Z1211 Encounter for screening for malignant neoplasm of colon: Secondary | ICD-10-CM | POA: Diagnosis not present

## 2021-01-07 DIAGNOSIS — R6889 Other general symptoms and signs: Secondary | ICD-10-CM

## 2021-01-07 DIAGNOSIS — R0989 Other specified symptoms and signs involving the circulatory and respiratory systems: Secondary | ICD-10-CM

## 2021-01-07 DIAGNOSIS — G8929 Other chronic pain: Secondary | ICD-10-CM

## 2021-01-07 MED ORDER — NA SULFATE-K SULFATE-MG SULF 17.5-3.13-1.6 GM/177ML PO SOLN: 1.0000 | Freq: Once | ORAL | 0 refills | Status: AC

## 2021-01-07 NOTE — Patient Instructions (Signed)
You have been scheduled for an endoscopy and colonoscopy. Please follow the written instructions given to you at your visit today. Please pick up your prep supplies at the pharmacy within the next 1-3 days. If you use inhalers (even only as needed), please bring them with you on the day of your procedure.  If you are age 57 or older, your body mass index should be between 23-30. Your Body mass index is 21.72 kg/m. If this is out of the aforementioned range listed, please consider follow up with your Primary Care Provider.  If you are age 74 or younger, your body mass index should be between 19-25. Your Body mass index is 21.72 kg/m. If this is out of the aformentioned range listed, please consider follow up with your Primary Care Provider.   __________________________________________________________  The [facility] providers would like to encourage you to use [facility] to communicate with providers for non-urgent requests or questions.  Due to long hold times on the telephone, sending your provider a message by [facility] may be a faster and more efficient way to get a response.  Please allow 48 business hours for a response.  Please remember that this is for non-urgent requests.

## 2021-01-07 NOTE — Progress Notes (Signed)
Chief Complaint: "Yeast infection"  HPI:    Tracy Ross is a 55 year old Caucasian female with a past medical history as listed below, known to Dr. Lavon Paganini, who presents to clinic today with a complaint of "a yeast infection".    10/24/2019 EGD with LA grade B esophagitis found 32-35 cm from incisors, gastroesophageal flap valve was visualized endoscopically and classified as Hill grade 2, patchy mild inflammation characterized by congestion and erythema in the cardia, antrum and prepyloric region of the stomach and normal duodenum.  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    08/12/2020 patient seen in clinic by Dr. Lavon Paganini for epigastric pain and globus sensation.  At that time patient symptoms had significantly improved with dietary changes.  She was told to use clotrimazole lozenges up to 4 times daily as needed for recurrent oral thrush.  There was plan to repeat EGD with 48-hour pH Bravo placement and schedule colonoscopy after she recovered from C-spine surgery.    11/06/2020 patient called and described that she felt like she had a yeast infection again in her stomach and esophagus.  Explained that she had had a back surgery and developed an infection while having a procedure of her [facility].  Also described chronic abdominal pain.  She was prescribed Diflucan 100 mg daily x10 days and told to follow-up with [place] prior to EGD and colonoscopy and that we would need clearance from spine surgeon.    12/24/2020 patient called back with "yeast in my throat and stomach".  She was given Diflucan 400 mg x 1 dose followed by 200 mg a day for another 13 days by Dr. Adela Lank.    Today, the patient presents to clinic accompanied by notes that she has taken regarding her health care recently.  She was following with [facility] spinal surgery and has had 3 surgeries in total, the first 08/19/2020, repair of this 09/21/2020, she then was hospitalized as she acquired an infection  along her hardware.  She had repeat surgery to clean out infection 10/15/2020.  Over this time was on a lot of antibiotics.  She was started with infectious disease in [place] and had a PICC line.  She was on Cefazolin 2 g 3 times a day, Rifampin 300 mg twice a day and Keflex.  Her last dose of antibiotics was on 12/27/2020 and she was released from their care.  During this time also developed some spotting/postmenopausal bleeding 11/08/2020.  She had work-up including a sonogram and ovarian biopsy and all was benign.  They suspected this was from the rifampin.  As the bleeding stopped after she was done with this antibiotic.    As far as GI symptoms the patient tells me that she was doing well with a low sugar diet and avoiding alcohol as far as keeping the yeast at bay, but developed symptoms including some epigastric discomfort and throat clearing/throat pain 11/06/2020 as above and was treated with fluconazole.  She then had return of symptoms 12/24/2020 and tells me the high dose of Diflucan seem to kick things out for the most part.  She does continue with some persistent throat mucus which she cannot clear, seems to be worse at night when laying down and Nasacort seems to provide some relief but does not use this because she was told was a steroid and can make her yeast infection worse.  Tells me that she is ready to go forward with GI procedures to figure out what is going on.  Reminds me she needs a colonoscopy because she is never had one.    Does have consult with an ENT scheduled in a week.    Denies fever, chills, weight loss or blood in her stool.  Past Medical History:  Diagnosis Date   Allergy    Arthritis    back and hips   Asthma    allergy driven, reactive airway   Complication of anesthesia    Difficult to awake after having wisdom teeth extracted approx 30 years ago   Family history of adverse reaction to anesthesia    Pt stated "my sisters are very sensitive to anesthesia and it does  not take a whole lot to put them to sleep"   Thyroid disease    hypothyroid    Past Surgical History:  Procedure Laterality Date   BACK SURGERY     SPINAL FUSION  08/2019   SPINE SURGERY     t-3 to pelvis    TOTAL HIP ARTHROPLASTY Right 06/10/2014   Procedure: RIGHT TOTAL HIP ARTHROPLASTY ANTERIOR APPROACH;  Surgeon: Kathryne Hitchhristopher Y Blackman, MD;  [facility];  Service: Orthopedics;  Laterality: Right;   TRACHEOSTOMY     as a child   WISDOM TOOTH EXTRACTION      Current Outpatient Medications  Medication Sig Dispense Refill   BIOTIN PO Take 1 tablet by mouth daily.     buPROPion (WELLBUTRIN XL) 300 MG 24 hr tablet Take 300 mg by mouth daily.      Cholecalciferol (VITAMIN D3) 1.25 MG (50000 UT) CAPS Take by mouth.     EPINEPHrine 0.3 mg/0.3 mL IJ SOAJ injection Inject 0.3 mg into the muscle once.     Multiple Vitamin (MULTIVITAMIN WITH MINERALS) TABS tablet Take 1 tablet by mouth daily.     Omega-3 Fatty Acids (FISH OIL) 1000 MG CAPS Take 1 capsule by mouth daily.     progesterone (PROMETRIUM) 100 MG capsule progesterone micronized 100 mg capsule  TAKE 1 CAPSULE BY MOUTH EVERY DAY AT NIGHT     thyroid (ARMOUR) 90 MG tablet Take 90 mg by mouth daily.     Turmeric 500 MG CAPS Take 500 mg by mouth 2 (two) times daily.     zolpidem (AMBIEN) 5 MG tablet zolpidem 5 mg tablet  TAKE 1 TABLET BY MOUTH AT BEDTIME AS NEEDED     No current facility-administered medications for this visit.    Allergies as of 01/07/2021 - Review Complete 01/07/2021  Allergen Reaction Noted   Bee venom Anaphylaxis and Hives 12/26/2012   Nitrofurantoin macrocrystal Nausea And Vomiting 08/08/2016    Family History  Problem Relation Age of Onset   Diabetes Mother    Hypertension Mother    Heart attack Father    Breast cancer Neg Hx    Colon cancer Neg Hx    Stomach cancer Neg Hx    Pancreatic cancer Neg Hx    Esophageal cancer Neg Hx    Rectal cancer Neg Hx     Social History   Socioeconomic  History   Marital status: Married    Spouse name: Not on file   Number of children: Not on file   Years of education: Not on file   Highest education level: Not on file  Occupational History   Not on file  Tobacco Use   Smoking status: Never    Passive exposure: Never   Smokeless tobacco: Never  Vaping Use   Vaping Use: Never used  Substance and Sexual Activity  Alcohol use: Yes    Comment: socially-not currently drinking   Drug use: No   Sexual activity: Not Currently  Other Topics Concern   Not on file  Social History Narrative   Not on file   Social Determinants of Health   Financial Resource Strain: Not on file  Food Insecurity: Not on file  Transportation Needs: Not on file  Physical Activity: Not on file  Stress: Not on file  Social Connections: Not on file  Intimate Partner Violence: Not on file    Review of Systems:    Constitutional: No fever or chills Cardiovascular: No chest pain Respiratory: No SOB Gastrointestinal: See HPI and otherwise negative   Physical Exam:  Vital signs: BP 118/80   Pulse 60   Ht 5\' 3"  (1.6 m)   Wt 122 lb 9.6 oz (55.6 kg)   LMP  (LMP Unknown)   SpO2 99%   BMI 21.72 kg/m    Constitutional:   Pleasant Caucasian female appears to be in NAD, Well developed, Well nourished, alert and cooperative Respiratory: Respirations even and unlabored. Lungs clear to auscultation bilaterally.   No wheezes, crackles, or rhonchi.  Cardiovascular: Normal S1, S2. No MRG. Regular rate and rhythm. No peripheral edema, cyanosis or pallor.  Gastrointestinal:  Soft, nondistended, nontender. No rebound or guarding. Normal bowel sounds. No appreciable masses or hepatomegaly. Rectal:  Not performed.  Psychiatric: Demonstrates good judgement and reason without abnormal affect or behaviors.  RELEVANT LABS AND IMAGING: CBC    Component Value Date/Time   WBC 13.4 (H) 10/13/2020 0940   RBC 3.33 (L) 10/13/2020 0940   HGB 11.0 (L) 10/13/2020 0940    HCT 32.9 (L) 10/13/2020 0940   PLT 372 10/13/2020 0940   MCV 98.8 10/13/2020 0940   MCH 33.0 10/13/2020 0940   MCHC 33.4 10/13/2020 0940   RDW 13.6 10/13/2020 0940   LYMPHSABS 0.5 (L) 10/13/2020 0940   MONOABS 0.6 10/13/2020 0940   EOSABS 0.1 10/13/2020 0940   BASOSABS 0.0 10/13/2020 0940    CMP     Component Value Date/Time   NA 139 10/13/2020 0940   K 3.7 10/13/2020 0940   CL 106 10/13/2020 0940   CO2 24 10/13/2020 0940   GLUCOSE 102 (H) 10/13/2020 0940   BUN 13 10/13/2020 0940   CREATININE 0.68 10/13/2020 0940   CALCIUM 9.1 10/13/2020 0940   PROT 7.3 07/08/2020 1116   ALBUMIN 5.1 07/08/2020 1116   AST 23 07/08/2020 1116   ALT 18 07/08/2020 1116   ALKPHOS 86 07/08/2020 1116   BILITOT 0.5 07/08/2020 1116   GFRNONAA >60 10/13/2020 0940   GFRAA >90 06/11/2014 0400    Assessment: 1.  History of Candida of the esophagus: Treated recently x2, continued symptoms of throat clearing and some epigastric pain 2.  Throat clearing: See above question ongoing Candida+/-postnasal drip 3.  Epigastric pain: Patient tells me she always gets this when she has Candida, it continues but is occasionally just a sharper pain and other times feels like a spasm further up in her throat; consider esophageal spasm versus gastritis versus other 4.  Screening for colorectal cancer: Patient has never had a screening colonoscopy  Plan: 1.  EGD with 48-hour pH Bravo placement and colonoscopy were scheduled with Dr. 06/13/2014.  Did provide the patient a detailed list of risks for the procedures and she agrees to proceed. 2.  We discussed antacids but the patient would like to avoid medicine like this if possible if she does not really  feel like it helps with symptoms. 3.  Encouraged her to keep follow-up with ENT. 4.  Continue sugar-free and alcohol free at diet for now until after results from EGD. 5.  Patient to follow in clinic per recommendations from Dr. Lavon Paganini after time of procedures.  Hyacinth Meeker, PA-C [facility] 01/07/2021, 3:00 PM  Cc: Joycelyn Rua, MD

## 2021-02-04 ENCOUNTER — Ambulatory Visit: Payer: BC Managed Care – PPO | Admitting: Gastroenterology

## 2021-02-15 ENCOUNTER — Telehealth: Payer: Self-pay | Admitting: Gastroenterology

## 2021-02-15 NOTE — Telephone Encounter (Signed)
We can hold off EGD and Bravo placement if her symptoms are better. Follow up in office visit in 3 months

## 2021-02-15 NOTE — Telephone Encounter (Signed)
Lm on vm for patient to return call 

## 2021-02-15 NOTE — Progress Notes (Signed)
Reviewed and agree with documentation and assessment and plan. K. Veena Cephas Revard , MD   

## 2021-02-15 NOTE — Telephone Encounter (Signed)
Spoke with patient, she is aware that Bravo/EGD cancelled. Will proceed with colonoscopy as scheduled. Pt is aware that she should follow up in the office in 3 months. Pt verbalized understanding and had no concerns at the end of the call.  3 month follow up recall in epic.

## 2021-02-15 NOTE — Telephone Encounter (Signed)
Spoke with patient, she states that she is not sure if she needs EGD. She states that she stopped all of the antibiotics that she was on in July. She reports that her stomach is back to normal and she is no longer having pain. She states that she saw ENT on 01/12/21 and they recommended that she try stopping alcohol, caffeine, chocolate and peppermint. Pt states stopping all of those has improved her throat clearing, she states that she still has some but not as much. Pt also reports that she is having bowel movements with no assistance, pt states that this is not normal for her. Please advise, Victorino Dike is out of the office today.

## 2021-02-26 ENCOUNTER — Encounter: Payer: Self-pay | Admitting: Gastroenterology

## 2021-02-26 ENCOUNTER — Other Ambulatory Visit: Payer: Self-pay

## 2021-02-26 ENCOUNTER — Ambulatory Visit (AMBULATORY_SURGERY_CENTER): Payer: BC Managed Care – PPO | Admitting: Gastroenterology

## 2021-02-26 VITALS — BP 110/66 | HR 57 | Temp 97.8°F | Resp 12 | Ht 63.0 in | Wt 122.0 lb

## 2021-02-26 DIAGNOSIS — Z1211 Encounter for screening for malignant neoplasm of colon: Secondary | ICD-10-CM | POA: Diagnosis not present

## 2021-02-26 MED ORDER — SODIUM CHLORIDE 0.9 % IV SOLN: 500.0000 mL | Freq: Once | INTRAVENOUS | Status: DC

## 2021-02-26 NOTE — Progress Notes (Signed)
Check-in-aer  V/s-sm

## 2021-02-26 NOTE — Op Note (Signed)
[facility] Patient Name: Jenai Scaletta Procedure Date: 02/26/2021 8:08 AM MRN: [id number] Endoscopist: Napoleon Form , MD Age: 55 Referring MD:  Date of Birth: 12/14/1965 Gender: Female Account #: [id number] Procedure:                Colonoscopy Indications:              Screening for colorectal malignant neoplasm Medicines:                Monitored Anesthesia Care Procedure:                Pre-Anesthesia Assessment:                           - Prior to the procedure, a History and Physical                            was performed, and patient medications and                            allergies were reviewed. The patient's tolerance of                            previous anesthesia was also reviewed. The risks                            and benefits of the procedure and the sedation                            options and risks were discussed with the patient.                            All questions were answered, and informed consent                            was obtained. Prior Anticoagulants: The patient has                            taken no previous anticoagulant or antiplatelet                            agents. ASA Grade Assessment: II - A patient with                            mild systemic disease. After reviewing the risks                            and benefits, the patient was deemed in                            satisfactory condition to undergo the procedure.                           After obtaining informed consent, the colonoscope  was passed under direct vision. Throughout the                            procedure, the patient's blood pressure, pulse, and                            oxygen saturations were monitored continuously. The                            Olympus PCF-H190DL ([id number]) Colonoscope was                            introduced through the anus and advanced to the the                            cecum,  identified by appendiceal orifice and                            ileocecal valve. The colonoscopy was performed                            without difficulty. The quality of the bowel                            preparation was excellent. The ileocecal valve,                            appendiceal orifice, and rectum were photographed.                            The patient tolerated the procedure well. Scope In: 8:13:54 AM Scope Out: 8:30:50 AM Scope Withdrawal Time: 0 hours 8 minutes 54 seconds  Total Procedure Duration: 0 hours 16 minutes 56 seconds  Findings:                 The perianal and digital rectal examinations were                            normal.                           Non-bleeding external and internal hemorrhoids were                            found during retroflexion. The hemorrhoids were                            medium-sized. Complications:            No immediate complications. Estimated Blood Loss:     Estimated blood loss was minimal. Impression:               - Non-bleeding external and internal hemorrhoids.                           - No specimens collected. Recommendation:           -  Patient has a contact number available for                            emergencies. The signs and symptoms of potential                            delayed complications were discussed with the                            patient. Return to normal activities tomorrow.                            Written discharge instructions were provided to the                            patient.                           - Resume previous diet.                           - Continue present medications.                           - Repeat colonoscopy in 10 years for screening                            purposes. Napoleon Form, MD 02/26/2021 8:47:04 AM This report has been signed electronically.

## 2021-02-26 NOTE — Patient Instructions (Addendum)
Handout on hemorrhoids given. Resume regular diet. No repeat colonoscopy for 10 years!   YOU HAD AN ENDOSCOPIC PROCEDURE TODAY AT THE [facility]:   Refer to the procedure report that was given to you for any specific questions about what was found during the examination.  If the procedure report does not answer your questions, please call your gastroenterologist to clarify.  If you requested that your care partner not be given the details of your procedure findings, then the procedure report has been included in a sealed envelope for you to review at your convenience later.  YOU SHOULD EXPECT: Some feelings of bloating in the abdomen. Passage of more gas than usual.  Walking can help get rid of the air that was put into your GI tract during the procedure and reduce the bloating. If you had a lower endoscopy (such as a colonoscopy or flexible sigmoidoscopy) you may notice spotting of blood in your stool or on the toilet paper. If you underwent a bowel prep for your procedure, you may not have a normal bowel movement for a few days.  Please Note:  You might notice some irritation and congestion in your nose or some drainage.  This is from the oxygen used during your procedure.  There is no need for concern and it should clear up in a day or so.  SYMPTOMS TO REPORT IMMEDIATELY:  Following lower endoscopy (colonoscopy or flexible sigmoidoscopy):  Excessive amounts of blood in the stool  Significant tenderness or worsening of abdominal pains  Swelling of the abdomen that is new, acute  Fever of 100F or higher   For urgent or emergent issues, a gastroenterologist can be reached at any hour by calling [phone]. Do not use MyChart messaging for urgent concerns.    DIET:  We do recommend a small meal at first, but then you may proceed to your regular diet.  Drink plenty of fluids but you should avoid alcoholic beverages for 24 hours.  ACTIVITY:  You should plan to take it easy  for the rest of today and you should NOT DRIVE or use heavy machinery until tomorrow (because of the sedation medicines used during the test).    FOLLOW UP: Our staff will call the number listed on your records 48-72 hours following your procedure to check on you and address any questions or concerns that you may have regarding the information given to you following your procedure. If we do not reach you, we will leave a message.  We will attempt to reach you two times.  During this call, we will ask if you have developed any symptoms of COVID 19. If you develop any symptoms (ie: fever, flu-like symptoms, shortness of breath, cough etc.) before then, please call [phone].  If you test positive for Covid 19 in the 2 weeks post procedure, please call and report this information to [place].    If any biopsies were taken you will be contacted by phone or by letter within the next 1-3 weeks.  Please call us at [phone] if you have not heard about the biopsies in 3 weeks.    SIGNATURES/CONFIDENTIALITY: You and/or your care partner have signed paperwork which will be entered into your electronic medical record.  These signatures attest to the fact that that the information above on your After Visit Summary has been reviewed and is understood.  Full responsibility of the confidentiality of this discharge information lies with you and/or your care-partner.

## 2021-02-26 NOTE — Progress Notes (Signed)
[facility] History and Physical   Primary Care Physician:  Joycelyn Rua, MD   Reason for Procedure:  Colorectal cancer screening  Plan:    Screening colonoscopy with possible interventions as needed     HPI: Tracy Ross is a very pleasant 55 y.o. female here for screening colonoscopy. Denies any nausea, vomiting, abdominal pain, melena or bright red blood per rectum  The risks and benefits as well as alternatives of endoscopic procedure(s) have been discussed and reviewed. All questions answered. The patient agrees to proceed.    Past Medical History:  Diagnosis Date   Allergy    Anemia    Arthritis    back and hips   Asthma    allergy driven, reactive airway   Complication of anesthesia    Difficult to awake after having wisdom teeth extracted approx 30 years ago   Family history of adverse reaction to anesthesia    Pt stated "my sisters are very sensitive to anesthesia and it does not take a whole lot to put them to sleep"   Thyroid disease    hypothyroid    Past Surgical History:  Procedure Laterality Date   BACK SURGERY     NECK SURGERY  2019   SPINAL FUSION  08/2019   SPINE SURGERY     t-3 to pelvis    SPINE SURGERY     TOTAL HIP ARTHROPLASTY Right 06/10/2014   Procedure: RIGHT TOTAL HIP ARTHROPLASTY ANTERIOR APPROACH;  Surgeon: Kathryne Hitch, MD;  Location: [facility] OR;  Service: Orthopedics;  Laterality: Right;   TRACHEOSTOMY     as a child   WISDOM TOOTH EXTRACTION      Prior to Admission medications   Medication Sig Start Date End Date Taking? Authorizing Provider  buPROPion (WELLBUTRIN XL) 300 MG 24 hr tablet Take 300 mg by mouth daily.  11/27/13  Yes [provider]  Cholecalciferol (VITAMIN D3) 1.25 MG (50000 UT) CAPS Take by mouth.   Yes [provider]  naproxen sodium (ALEVE) 220 MG tablet Take 220 mg by mouth.   Yes [provider]  thyroid (ARMOUR) 90 MG tablet Take 90 mg by mouth daily.    Yes [provider]  zolpidem (AMBIEN) 5 MG tablet zolpidem 5 mg tablet  TAKE 1 TABLET BY MOUTH AT BEDTIME AS NEEDED 08/04/16  Yes [provider]  BIOTIN PO Take 1 tablet by mouth daily.    [provider]  EPINEPHrine 0.3 mg/0.3 mL IJ SOAJ injection Inject 0.3 mg into the muscle once.    [provider]  Multiple Vitamin (MULTIVITAMIN WITH MINERALS) TABS tablet Take 1 tablet by mouth daily.    [provider]  Omega-3 Fatty Acids (FISH OIL) 1000 MG CAPS Take 1 capsule by mouth daily.    [provider]  progesterone (PROMETRIUM) 100 MG capsule progesterone micronized 100 mg capsule  TAKE 1 CAPSULE BY MOUTH EVERY DAY AT NIGHT    [provider]  Turmeric 500 MG CAPS Take 500 mg by mouth 2 (two) times daily.    [provider]    Current Outpatient Medications  Medication Sig Dispense Refill   buPROPion (WELLBUTRIN XL) 300 MG 24 hr tablet Take 300 mg by mouth daily.      Cholecalciferol (VITAMIN D3) 1.25 MG (50000 UT) CAPS Take by mouth.     naproxen sodium (ALEVE) 220 MG tablet Take 220 mg by mouth.     thyroid (ARMOUR) 90 MG tablet Take 90 mg  by mouth daily.     zolpidem (AMBIEN) 5 MG tablet zolpidem 5 mg tablet  TAKE 1 TABLET BY MOUTH AT BEDTIME AS NEEDED     BIOTIN PO Take 1 tablet by mouth daily.     EPINEPHrine 0.3 mg/0.3 mL IJ SOAJ injection Inject 0.3 mg into the muscle once.     Multiple Vitamin (MULTIVITAMIN WITH MINERALS) TABS tablet Take 1 tablet by mouth daily.     Omega-3 Fatty Acids (FISH OIL) 1000 MG CAPS Take 1 capsule by mouth daily.     progesterone (PROMETRIUM) 100 MG capsule progesterone micronized 100 mg capsule  TAKE 1 CAPSULE BY MOUTH EVERY DAY AT NIGHT     Turmeric 500 MG CAPS Take 500 mg by mouth 2 (two) times daily.     Current Facility-Administered Medications  Medication Dose Route Frequency Provider Last Rate Last Admin   0.9 %  sodium chloride infusion  500 mL Intravenous Once Napoleon Form, MD        Allergies as of 02/26/2021 - Review Complete 02/26/2021  Allergen Reaction Noted   Bee venom Anaphylaxis and Hives 12/26/2012   Nitrofurantoin macrocrystal Nausea And Vomiting 08/08/2016    Family History  Problem Relation Age of Onset   Diabetes Mother    Hypertension Mother    Heart attack Father    Breast cancer Neg Hx    Colon cancer Neg Hx    Stomach cancer Neg Hx    Pancreatic cancer Neg Hx    Esophageal cancer Neg Hx    Rectal cancer Neg Hx    Colon polyps Neg Hx     Social History   Socioeconomic History   Marital status: Married    Spouse name: Not on file   Number of children: Not on file   Years of education: Not on file   Highest education level: Not on file  Occupational History   Not on file  Tobacco Use   Smoking status: Never    Passive exposure: Never   Smokeless tobacco: Never  Vaping Use   Vaping Use: Never used  Substance and Sexual Activity   Alcohol use: Yes    Comment: socially-not currently drinking   Drug use: No   Sexual activity: Not Currently  Other Topics Concern   Not on file  Social History Narrative   Not on file   Social Determinants of Health   Financial Resource Strain: Not on file  Food Insecurity: Not on file  Transportation Needs: Not on file  Physical Activity: Not on file  Stress: Not on file  Social Connections: Not on file  Intimate Partner Violence: Not on file    Review of Systems:  All other review of systems negative except as mentioned in the HPI.  Physical Exam: Vital signs in last 24 hours: BP 124/61 (Patient Position: Sitting)   Pulse 68   Temp 97.8 F (36.6 C)   Ht 5\' 3"  (1.6 m)   Wt 122 lb (55.3 kg)   LMP  (LMP Unknown)   SpO2 96%   BMI 21.61 kg/m     General:   Alert, NAD Lungs:  Clear .   Heart:  Regular rate and rhythm Abdomen:  Soft, nontender and nondistended. Neuro/Psych:  Alert and cooperative. Normal mood and affect. A and O x 3  Reviewed labs, radiology  imaging, old records and pertinent past GI work up  Patient is appropriate for planned procedure(s) and anesthesia in an ambulatory setting   K.  Denzil Magnuson , MD [phone]

## 2021-02-26 NOTE — Progress Notes (Signed)
Sedate, gd SR, tolerated procedure well, VSS, report to RN 

## 2021-03-02 ENCOUNTER — Telehealth: Payer: Self-pay | Admitting: *Deleted

## 2021-03-02 NOTE — Telephone Encounter (Signed)
First follow up call attempt.  LVM. 

## 2021-03-02 NOTE — Telephone Encounter (Signed)
  Follow up Call-  Call back number 02/26/2021 10/24/2019  Post procedure Call Back phone  # [phone]  Permission to leave phone message Yes Yes  Some recent data might be hidden     No answer at 2nd attempt follow up phone call.  Left message on voicemail.

## 2021-03-24 ENCOUNTER — Encounter: Payer: Self-pay | Admitting: Gastroenterology

## 2021-05-05 ENCOUNTER — Ambulatory Visit: Payer: Self-pay

## 2021-05-05 ENCOUNTER — Encounter: Payer: Self-pay | Admitting: Orthopaedic Surgery

## 2021-05-05 ENCOUNTER — Other Ambulatory Visit: Payer: Self-pay

## 2021-05-05 ENCOUNTER — Ambulatory Visit (INDEPENDENT_AMBULATORY_CARE_PROVIDER_SITE_OTHER): Payer: BC Managed Care – PPO | Admitting: Orthopaedic Surgery

## 2021-05-05 DIAGNOSIS — Z96641 Presence of right artificial hip joint: Secondary | ICD-10-CM

## 2021-05-05 MED ORDER — METHYLPREDNISOLONE 4 MG PO TABS: ORAL_TABLET | ORAL | 0 refills | Status: AC

## 2021-05-05 MED ORDER — TIZANIDINE HCL 4 MG PO TABS: 4.0000 mg | ORAL_TABLET | Freq: Three times a day (TID) | ORAL | 0 refills | Status: DC | PRN
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
## 2021-05-07 ENCOUNTER — Other Ambulatory Visit: Payer: Self-pay

## 2021-05-07 ENCOUNTER — Telehealth: Payer: Self-pay | Admitting: Orthopaedic Surgery

## 2021-05-07 DIAGNOSIS — M545 Low back pain, unspecified: Secondary | ICD-10-CM

## 2021-05-07 NOTE — Telephone Encounter (Signed)
Patient aware we sent order for MRI

## 2021-05-07 NOTE — Telephone Encounter (Signed)
Patient called wanting to let Dr Magnus Ivan know that the bottom of her right heel has a stinging burning sensation which started in the middle of the night last night. Patient asked if she can be set up for an MRI as soon as possible. Patient said her right hip is about the same.  The number to contact patient is [phone]

## 2021-05-11 ENCOUNTER — Telehealth: Payer: Self-pay | Admitting: Orthopaedic Surgery

## 2021-05-11 NOTE — Telephone Encounter (Signed)
Left message for pt to return call and make a follow up appt to schedule post MRI after 05/29/21 with CB

## 2021-05-14 ENCOUNTER — Other Ambulatory Visit: Payer: Self-pay | Admitting: Orthopaedic Surgery

## 2021-05-19 ENCOUNTER — Ambulatory Visit
Admission: RE | Admit: 2021-05-19 | Discharge: 2021-05-19 | Disposition: A | Discharge status: Home / Self Care | Payer: BC Managed Care – PPO | Source: Ambulatory Visit | Attending: Orthopaedic Surgery | Admitting: Orthopaedic Surgery

## 2021-05-19 DIAGNOSIS — M545 Low back pain, unspecified: Secondary | ICD-10-CM

## 2021-05-19 MED ORDER — GADOBENATE DIMEGLUMINE 529 MG/ML IV SOLN
11.0000 mL | Freq: Once | INTRAVENOUS | Status: AC | PRN
  Administered 2021-05-19: 11 mL via INTRAVENOUS

## 2021-05-21 ENCOUNTER — Other Ambulatory Visit: Payer: BC Managed Care – PPO

## 2021-05-24 ENCOUNTER — Ambulatory Visit
Admission: RE | Admit: 2021-05-24 | Discharge: 2021-05-24 | Disposition: A | Discharge status: Home / Self Care | Payer: BC Managed Care – PPO | Source: Ambulatory Visit | Attending: Orthopaedic Surgery | Admitting: Orthopaedic Surgery

## 2021-05-24 ENCOUNTER — Other Ambulatory Visit: Payer: Self-pay

## 2021-05-24 ENCOUNTER — Telehealth: Payer: Self-pay

## 2021-05-24 ENCOUNTER — Other Ambulatory Visit: Payer: Self-pay | Admitting: Orthopaedic Surgery

## 2021-05-24 DIAGNOSIS — Z96641 Presence of right artificial hip joint: Secondary | ICD-10-CM

## 2021-05-24 MED ORDER — HYDROCODONE-ACETAMINOPHEN 5-325 MG PO TABS: 1.0000 | ORAL_TABLET | Freq: Four times a day (QID) | ORAL | 0 refills | Status: DC | PRN

## 2021-05-24 NOTE — Telephone Encounter (Signed)
Please advise 
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
## 2021-05-24 NOTE — Telephone Encounter (Signed)
I called and talked to the Tracy Ross. She would really like a MRI of her pelvis. She stated she was unable to get out of bed this morning due to pain.She doesn't feel like it is her back. She stated pain is in the right posterior hip area and it feels deep. I talked to Dr. Magnus Ivan and he stated it was ok to proceed with ordering a MRI of her Pelvis. She stated she wanted this done asap and will call around to see if someone can get this done today. Tracy Ross was advised she may have to go to the ER to have it done today. She didn't want to go to the hospital unless she has to. She will call back to let us know once she has the scan done.

## 2021-05-26 ENCOUNTER — Telehealth: Payer: Self-pay | Admitting: Orthopaedic Surgery

## 2021-05-26 DIAGNOSIS — M545 Low back pain, unspecified: Secondary | ICD-10-CM

## 2021-05-26 DIAGNOSIS — Z96641 Presence of right artificial hip joint: Secondary | ICD-10-CM

## 2021-05-26 NOTE — Telephone Encounter (Signed)
Please advise 

## 2021-05-26 NOTE — Telephone Encounter (Signed)
Referral placed in chart. Pt would like this done asap please

## 2021-05-26 NOTE — Telephone Encounter (Signed)
Pt called stating she's in a lot of pain and would like to know if she could be worked in sooner than her 06/02/21 appt. Pt also asked for a CB with her MRI results.   [phone]

## 2021-05-27 ENCOUNTER — Other Ambulatory Visit: Payer: Self-pay | Admitting: Orthopaedic Surgery

## 2021-05-27 ENCOUNTER — Telehealth: Payer: Self-pay | Admitting: Orthopaedic Surgery

## 2021-05-27 DIAGNOSIS — M545 Low back pain, unspecified: Secondary | ICD-10-CM

## 2021-05-27 DIAGNOSIS — Z96641 Presence of right artificial hip joint: Secondary | ICD-10-CM

## 2021-05-27 NOTE — Addendum Note (Signed)
Addended by: Barbette Or on: 05/27/2021 09:35 AM   Modules accepted: Orders

## 2021-05-27 NOTE — Telephone Encounter (Signed)
Sw Tracy Ross at [facility], they are going to try to get pt in tomorrow 05/28/21 at 730am, they will contact pt to schedule

## 2021-05-27 NOTE — Telephone Encounter (Signed)
Pt called requesting a call back. Pt states she spoke to Dr. Magnus Ivan over the phone and he went over her MRI and he sent a referral and set an appt with Dr. Alvester Morin. She is asking if she still need the appt she has set with Dr. Magnus Ivan next week. Please call pt at [phone].

## 2021-05-27 NOTE — Telephone Encounter (Signed)
Dr. Magnus Ivan pt.

## 2021-05-27 NOTE — Telephone Encounter (Signed)
Can [place] get her in sooner?

## 2021-05-29 ENCOUNTER — Other Ambulatory Visit: Payer: BC Managed Care – PPO

## 2021-06-02 ENCOUNTER — Ambulatory Visit: Payer: BC Managed Care – PPO | Admitting: Orthopaedic Surgery

## 2021-06-03 ENCOUNTER — Other Ambulatory Visit: Payer: Self-pay | Admitting: Orthopaedic Surgery

## 2021-06-03 ENCOUNTER — Telehealth: Payer: Self-pay | Admitting: Orthopaedic Surgery

## 2021-06-03 MED ORDER — HYDROCODONE-ACETAMINOPHEN 5-325 MG PO TABS
1.0000 | ORAL_TABLET | Freq: Four times a day (QID) | ORAL | 0 refills | Status: DC | PRN
Start: 2021-06-03 — End: 2022-05-18

## 2021-06-03 NOTE — Telephone Encounter (Signed)
Pt called to get a refill on her hydro medication. The best pharmacy is [id number] - [place], [place] - [address] and the best call back number if needed is [phone].

## 2021-06-03 NOTE — Telephone Encounter (Signed)
Please advise 

## 2021-06-08 ENCOUNTER — Encounter: Payer: Self-pay | Admitting: Physical Medicine and Rehabilitation

## 2021-06-08 ENCOUNTER — Ambulatory Visit: Payer: Self-pay

## 2021-06-08 ENCOUNTER — Other Ambulatory Visit: Payer: Self-pay

## 2021-06-08 ENCOUNTER — Ambulatory Visit (INDEPENDENT_AMBULATORY_CARE_PROVIDER_SITE_OTHER): Payer: BC Managed Care – PPO | Admitting: Physical Medicine and Rehabilitation

## 2021-06-08 ENCOUNTER — Encounter: Payer: Self-pay | Admitting: Physical Medicine & Rehabilitation

## 2021-06-08 DIAGNOSIS — M7071 Other bursitis of hip, right hip: Secondary | ICD-10-CM

## 2021-06-08 MED ORDER — TRIAMCINOLONE ACETONIDE 40 MG/ML IJ SUSP
40.0000 mg | INTRAMUSCULAR | Status: AC | PRN
  Administered 2021-06-08: 09:00:00 40 mg via INTRA_ARTICULAR

## 2021-06-08 MED ORDER — BUPIVACAINE HCL 0.25 % IJ SOLN
4.0000 mL | INTRAMUSCULAR | Status: AC | PRN
Start: 2021-06-08 — End: 2021-06-08
  Administered 2021-06-08: 09:00:00 4 mL via INTRA_ARTICULAR

## 2021-06-08 NOTE — Patient Instructions (Signed)

## 2021-06-08 NOTE — Progress Notes (Signed)
Pt state lower back pain that travels to her buttocks. Pt state she feels pain going dow her right leg and ankle. Pt state has been going to PT. Pt state walking, standing and laying down. Pt state she takes pain meds and uses ice to help ease her pain.  Numeric Pain Rating Scale and Functional Assessment Average Pain 4   In the last MONTH (on 0-10 scale) has pain interfered with the following?  1. General activity like being  able to carry out your everyday physical activities such as walking, climbing stairs, carrying groceries, or moving a chair?  Rating(8)   +Driver, -BT, -Dye Allergies.

## 2021-06-08 NOTE — Progress Notes (Signed)
° °  Tracy Ross - 55 y.o. female MRN [id number]  Date of birth: 1966/04/15  Office Visit Note: Visit Date: 06/08/2021 PCP: Joycelyn Rua, MD Referred by: Joycelyn Rua, MD  Subjective: Chief Complaint  Patient presents with   Lower Back - Pain   HPI:  Tracy Ross is a 55 y.o. female who comes in today at the request of Dr. Doneen Poisson for planned Right anesthetic hip arthrogram with fluoroscopic guidance.  The patient has failed conservative care including home exercise, medications, time and activity modification.  This injection will be diagnostic and hopefully therapeutic.  Please see requesting physician notes for further details and justification. Seems to have some radicular type symptoms.  ROS Otherwise per HPI.  Assessment & Plan: Visit Diagnoses:    ICD-10-CM   1. Ischial bursitis of right side  M70.71 XR C-ARM NO REPORT    Right Ischial Bursa Injection      Plan: No additional findings.   Meds & Orders: No orders of the defined types were placed in this encounter.   Orders Placed This Encounter  Procedures   Right Ischial Bursa Injection   XR C-ARM NO REPORT    Follow-up: Return for visit to requesting provider as needed.   Procedures: Right Ischial Bursa Injection on 06/08/2021 8:39 AM Indications: pain and diagnostic evaluation Details: 22 G 3.5 in needle, fluoroscopy-guided posterior approach  Arthrogram: No  Medications: 4 mL bupivacaine 0.25 %; 40 mg triamcinolone acetonide 40 MG/ML Outcome: tolerated well, no immediate complications  There was excellent flow of contrast outlining the ischial bursa.  There was no vascular flow noted. Procedure, treatment alternatives, risks and benefits explained, specific risks discussed. Consent was given by the patient. Immediately prior to procedure a time out was called to verify the correct patient, procedure, equipment, support staff and site/side marked as required. Patient was prepped  and draped in the usual sterile fashion.         Clinical History: No specialty comments available.     Objective:  VS:  HT:     WT:    BMI:      BP:    HR: bpm   TEMP: ( )   RESP:  Physical Exam   Imaging: No results found.

## 2021-08-10 ENCOUNTER — Ambulatory Visit: Payer: BC Managed Care – PPO | Admitting: Physical Medicine & Rehabilitation

## 2021-08-19 ENCOUNTER — Ambulatory Visit: Payer: BC Managed Care – PPO | Admitting: Physical Medicine & Rehabilitation

## 2022-04-25 IMAGING — CT CT CERVICAL SPINE W/O CM
3 series · 8 of 14 positions shown, 9 images · non-contrast
Comparison: Thoracic spine CT 07/14/2020

CLINICAL DATA: Mid low back pain.  Fusion of thoracic spine

EXAM:
CT CERVICAL SPINE WITHOUT CONTRAST
CT THORACIC SPINE WITHOUT CONTRAST
TECHNIQUE: Multidetector CT imaging of the cervical and thoracic spine was
performed without contrast. Multiplanar CT image reconstructions
were also generated.

[Series 2: cspine soft · axial · 0.39mm/px · z∈[-213,-159]mm · 2 of 81 slices shown]
[im 27/81  soft-tissue]
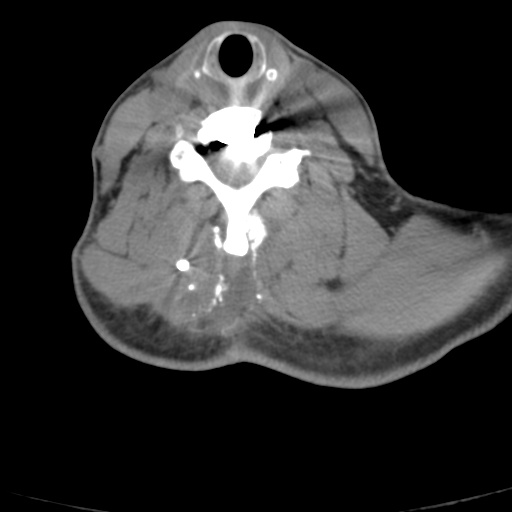
[im 54/81  soft-tissue]
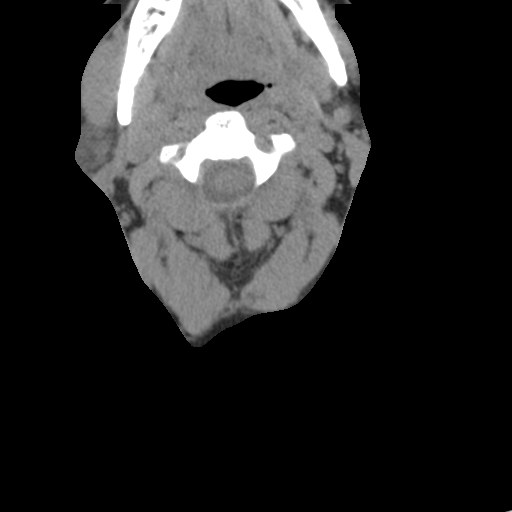

[Series 8: angled axial soft · axial · 0.29mm/px · z∈[-250,-171]mm · 3 of 83 slices shown]
[im 21/83  soft-tissue]
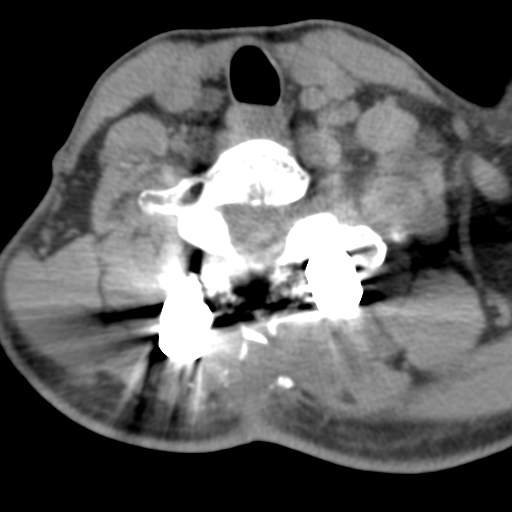
[im 42/83  soft-tissue]
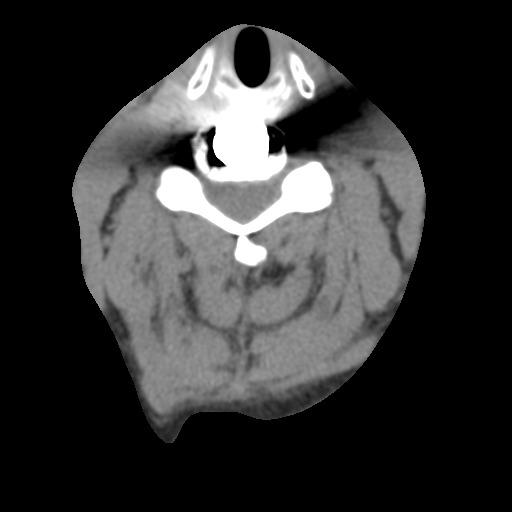
[im 62/83  soft-tissue]
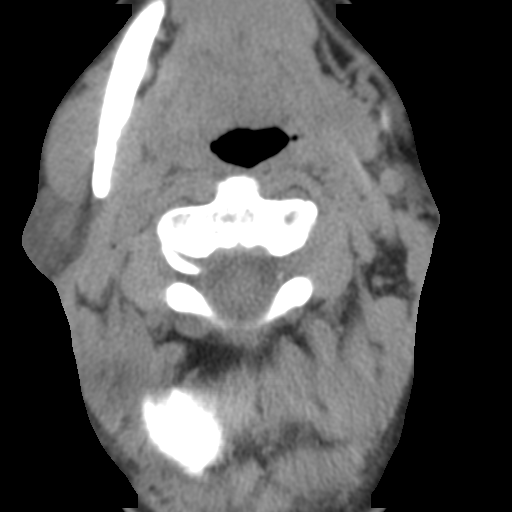

[Series 9: angled axial bone · axial · 0.29mm/px · z∈[-261,-182]mm · 3 of 86 slices shown, 4 images]
[im 22/86  soft-tissue]
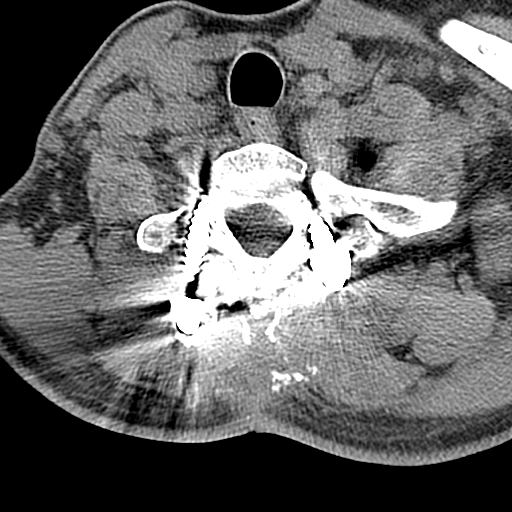
[im 22/86  bone]
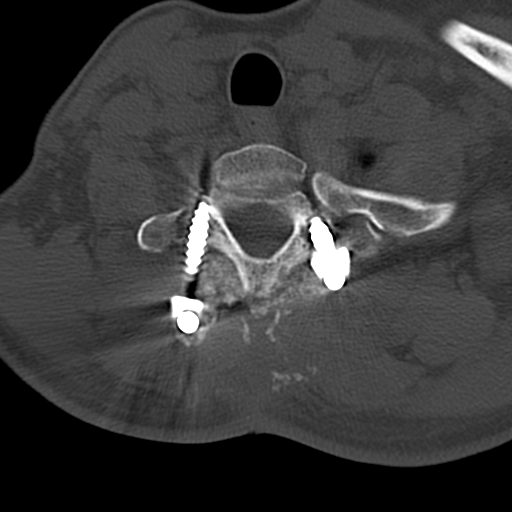
[im 43/86  bone]
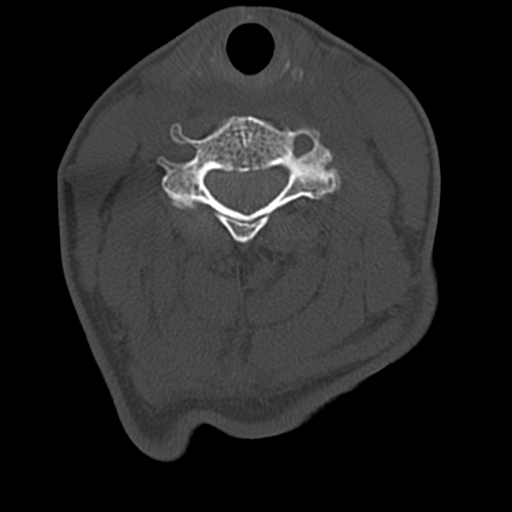
[im 64/86  bone]
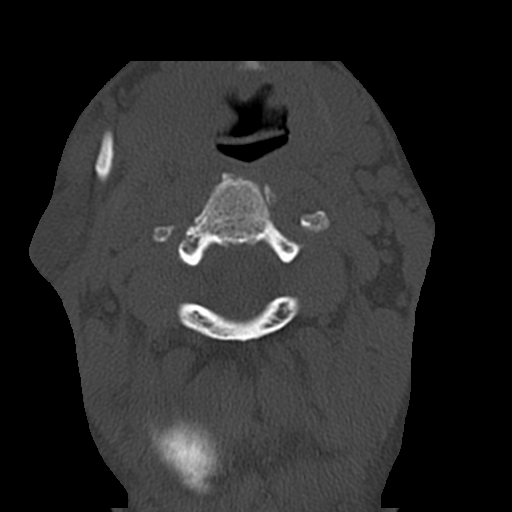

[8 of 14 positions shown; findings below may reference images not displayed]

FINDINGS: CT CERVICAL SPINE FINDINGS

Alignment: Hyperlordosis.

Skull base and vertebrae: Partial resection of the C7 spinous
process. No fracture or bone lesion. Disc arthroplasty at C4-5 and
C5-6 in good position.

Soft tissues and spinal canal: No swelling or mass seen about the
cervical spine. No detected intrathecal abnormality.

Disc levels:

C2-3: Unremarkable.

C3-4: Disc narrowing and uncovertebral spurring with facet spurring
asymmetric to the left. Moderate bony left foraminal impingement.
Mild right foraminal stenosis.

C4-5: Disc arthroplasty.  Patent appearance of the foramina

C5-6: Disc arthroplasty. There is residual spurring encroaching on
the left foramen but patent appearing on reformats.

C6-7: Minor spurring.  No visible bony impingement

C7-T1:Unremarkable

Upper chest: Reported below

CT THORACIC SPINE FINDINGS

Alignment: Near complete reduction of the T2-3 listhesis seen
previously. No new malalignment.

Vertebrae: Pre-existing thoraco pelvic fusion from T3 inferiorly.
The T3 and T7 screws were removed within new rod linking T1-T7 to
the pre-existing subjacent construct.

At T1 the right-sided pedicle screw tip extends beyond the pedicle
and into the right first rib head where there is a fracture.

The right T2 pedicle screw tip also extends lateral and beyond the
right pedicle into the right second rib head, without fracture.

The left T4 pedicle screw is lateral to the pedicle with partial
purchase in the left lateral body. This screw is associated with the
left fourth rib head without fracture.

None of the screws transect the foramina.

No vertebral fracture.

Paraspinal and other soft tissues: There is incisional fluid and
bone graft spanning the operative levels with margins difficult to
measure given noncontrast CT technique. The canal is not visible due
to metal artifact.

Disc levels: Vacuum phenomenon is seen at the T2-3, T3-4, T7-8,
T8-9, and T11-12 disc spaces. Due to metallic artifact
posterior-lateral arthrodesis is difficult to follow diffusely at
the level of pre-existing fusion.
IMPRESSION: 1. Posterolateral fusion which has been extended cranially to the
level of T1. The prior T2-3 listhesis has been nearly completely
reduced.
2. New hardware is described above. Notable right T1 pedicle screw
which extends into the right first rib head where there is fracture.
3. Incisional fluid and bone graft collection in the subcutaneous
space.

## 2022-05-18 ENCOUNTER — Encounter (HOSPITAL_BASED_OUTPATIENT_CLINIC_OR_DEPARTMENT_OTHER): Payer: Self-pay

## 2022-05-18 ENCOUNTER — Other Ambulatory Visit: Payer: Self-pay

## 2022-05-18 ENCOUNTER — Emergency Department (HOSPITAL_BASED_OUTPATIENT_CLINIC_OR_DEPARTMENT_OTHER)
Admission: EM | Admit: 2022-05-18 | Discharge: 2022-05-18 | Disposition: A | Discharge status: Home / Self Care | Payer: BC Managed Care – PPO | Attending: Emergency Medicine | Admitting: Emergency Medicine

## 2022-05-18 ENCOUNTER — Emergency Department (HOSPITAL_BASED_OUTPATIENT_CLINIC_OR_DEPARTMENT_OTHER): Payer: BC Managed Care – PPO

## 2022-05-18 DIAGNOSIS — S6992XA Unspecified injury of left wrist, hand and finger(s), initial encounter: Secondary | ICD-10-CM | POA: Diagnosis present

## 2022-05-18 DIAGNOSIS — W312XXA Contact with powered woodworking and forming machines, initial encounter: Secondary | ICD-10-CM | POA: Insufficient documentation

## 2022-05-18 DIAGNOSIS — S61209A Unspecified open wound of unspecified finger without damage to nail, initial encounter: Secondary | ICD-10-CM

## 2022-05-18 DIAGNOSIS — S66321A Laceration of extensor muscle, fascia and tendon of left index finger at wrist and hand level, initial encounter: Secondary | ICD-10-CM | POA: Diagnosis not present

## 2022-05-18 DIAGNOSIS — S56322A Laceration of extensor or abductor muscles, fascia and tendons of left thumb at forearm level, initial encounter: Secondary | ICD-10-CM | POA: Diagnosis not present

## 2022-05-18 MED ORDER — HYDROCODONE-ACETAMINOPHEN 5-325 MG PO TABS: 1.0000 | ORAL_TABLET | Freq: Four times a day (QID) | ORAL | 0 refills | Status: AC | PRN

## 2022-05-18 MED ORDER — CEFAZOLIN SODIUM 1 G IM
1.0000 g | Freq: Once | INTRAMUSCULAR | Status: AC
  Administered 2022-05-18: 1 g via INTRAMUSCULAR
  Filled 2022-05-18: qty 10

## 2022-05-18 MED ORDER — LIDOCAINE-EPINEPHRINE 2 %-1:100000 IJ SOLN
20.0000 mL | Freq: Once | INTRAMUSCULAR | Status: AC
  Administered 2022-05-18: 20 mL via INTRADERMAL

## 2022-05-18 MED ORDER — HYDROCODONE-ACETAMINOPHEN 5-325 MG PO TABS: 1.0000 | ORAL_TABLET | Freq: Four times a day (QID) | ORAL | 0 refills | Status: DC | PRN

## 2022-05-18 MED ORDER — CEPHALEXIN 500 MG PO CAPS: ORAL_CAPSULE | ORAL | 0 refills | Status: AC

## 2022-05-18 NOTE — ED Triage Notes (Signed)
Pt states she cut her  left thumb with a table saw. States she can still feel it. No pallor and warm warm to touch.

## 2022-05-18 NOTE — Discharge Instructions (Addendum)
WOUND CARE Please have your stitches/staples removed in 10 days  or sooner if you have concerns. You may do this at any available urgent care or at your primary care doctor's office.  Keep area clean and dry for 24 hours. Do not remove bandage, if applied.  After 24 hours, remove bandage and wash wound gently with mild soap and warm water. Reapply a new bandage after cleaning wound, if directed.  Continue daily cleansing with soap and water until stitches/staples are removed.  Do not apply any ointments or creams to the wound while stitches/staples are in place, as this may cause delayed healing.  Seek medical careif you experience any of the following signs of infection: Swelling, redness, pus drainage, streaking, fever >101.0 F  Seek care if you experience excessive bleeding that does not stop after 15-20 minutes of constant, firm pressure.   

## 2022-05-18 NOTE — ED Provider Notes (Signed)
[facility] EMERGENCY DEPT Provider Note   CSN: [id number] Arrival date & time: 05/18/22  1407     History  Chief Complaint  Patient presents with   Laceration    Tracy Ross is a 56 y.o. female who presents emergency department chief complaint of laceration.  Patient was using a table saw today to cut some blood and caught the some of her left thumb and nicked the left index finger.  She was seen in urgent care earlier today and was sent here.  This occurred about 1 PM.  She believes she is up-to-date on her tetanus vaccination.  She denies numbness or tingling.  She is unable to extend the left thumb   Laceration      Home Medications Prior to Admission medications   Medication Sig Start Date End Date Taking? Authorizing Provider  cephALEXin (KEFLEX) 500 MG capsule 2 caps po bid x 7 days 05/18/22  Yes Derrell Milanes, PA-C  BIOTIN PO Take 1 tablet by mouth daily.    [provider]  buPROPion (WELLBUTRIN XL) 300 MG 24 hr tablet Take 300 mg by mouth daily.  11/27/13   [provider]  Cholecalciferol (VITAMIN D3) 1.25 MG (50000 UT) CAPS Take by mouth.    [provider]  EPINEPHrine 0.3 mg/0.3 mL IJ SOAJ injection Inject 0.3 mg into the muscle once.    [provider]  HYDROcodone-acetaminophen (NORCO) 5-325 MG tablet Take 1 tablet by mouth every 6 (six) hours as needed for severe pain. 05/18/22   Nicolo Tomko, Cammy Copa, PA-C  methylPREDNISolone (MEDROL) 4 MG tablet Medrol dose pack. Take as instructed 05/05/21   Kathryne Hitch, MD  Multiple Vitamin (MULTIVITAMIN WITH MINERALS) TABS tablet Take 1 tablet by mouth daily.    [provider]  naproxen sodium (ALEVE) 220 MG tablet Take 220 mg by mouth.    [provider]  Omega-3 Fatty Acids (FISH OIL) 1000 MG CAPS Take 1 capsule by mouth daily.    [provider]  progesterone (PROMETRIUM) 100 MG capsule progesterone micronized 100 mg capsule   TAKE 1 CAPSULE BY MOUTH EVERY DAY AT NIGHT    [provider]  thyroid (ARMOUR) 90 MG tablet Take 90 mg by mouth daily.    [provider]  tiZANidine (ZANAFLEX) 4 MG tablet TAKE 1 TABLET (4 MG TOTAL) BY MOUTH EVERY 8 (EIGHT) HOURS AS NEEDED FOR MUSCLE SPASMS 06/03/21   Kathryne Hitch, MD  Turmeric 500 MG CAPS Take 500 mg by mouth 2 (two) times daily.    [provider]  zolpidem (AMBIEN) 5 MG tablet zolpidem 5 mg tablet  TAKE 1 TABLET BY MOUTH AT BEDTIME AS NEEDED 08/04/16   [provider]      Allergies    Bee venom and Nitrofurantoin macrocrystal    Review of Systems   Review of Systems  Physical Exam Updated Vital Signs BP (!) 145/78 (BP Location: Right Arm)   Pulse (!) 59   Temp 98.1 F (36.7 C) (Oral)   Resp 20   Ht 5\' 3"  (1.6 m)   Wt 55.3 kg   LMP  (LMP Unknown)   SpO2 98%   BMI 21.60 kg/m  Physical Exam Vitals and nursing note reviewed.  Constitutional:      General: She is not in acute distress.    Appearance: She is well-developed. She is not diaphoretic.  HENT:     Head: Normocephalic and atraumatic.     Right Ear: External ear  normal.     Left Ear: External ear normal.     Nose: Nose normal.     Mouth/Throat:     Mouth: Mucous membranes are moist.  Eyes:     General: No scleral icterus.    Conjunctiva/sclera: Conjunctivae normal.  Cardiovascular:     Rate and Rhythm: Normal rate and regular rhythm.     Heart sounds: Normal heart sounds. No murmur heard.    No friction rub. No gallop.  Pulmonary:     Effort: Pulmonary effort is normal. No respiratory distress.     Breath sounds: Normal breath sounds.  Abdominal:     General: Bowel sounds are normal. There is no distension.     Palpations: Abdomen is soft. There is no mass.     Tenderness: There is no abdominal tenderness. There is no guarding.  Musculoskeletal:     Cervical back: Normal range of motion.     Comments: Left hand with horseshoe shaped  laceration over the PIP of the left thumb.  Patient is unable to extend the left thumb but is able to abduct the thumb.  She has normal sensation.  There appears to be some penetration of the joint capsule over her left PIP joint. There is 1/2 cm laceration to the dorsum of her index finger.  Otherwise full range of motion of the fingers and normal sensation with good capillary refill.  Skin:    General: Skin is warm and dry.  Neurological:     Mental Status: She is alert and oriented to person, place, and time.  Psychiatric:        Behavior: Behavior normal.     ED Results / Procedures / Treatments   Labs (all labs ordered are listed, but only abnormal results are displayed) Labs Reviewed - No data to display  EKG None  Radiology DG Hand Complete Left  Result Date: 05/18/2022 CLINICAL DATA:  Laceration EXAM: LEFT HAND - COMPLETE 3 VIEW COMPARISON:  None Available. FINDINGS: There is no evidence of fracture or dislocation. Degenerative changes noted of the distal interphalangeal joints with joint space narrowing and osteophytes. No erosive changes or soft tissue calcifications. No subluxations. No periarticular osteopenia. Soft tissues are unremarkable. No radiopaque foreign bodies. IMPRESSION: Degenerative changes.  No acute osseous abnormalities Electronically Signed   By: Layla Maw M.D.   On: 05/18/2022 16:20    Procedures .Marland KitchenLaceration Repair  Date/Time: 05/18/2022 5:20 PM  Performed by: Arthor Captain, PA-C Authorized by: Arthor Captain, PA-C   Consent:    Consent obtained:  Verbal   Consent given by:  Patient   Risks discussed:  Infection and pain Universal protocol:    Patient identity confirmed:  Verbally with patient Anesthesia:    Anesthesia method:  Local infiltration   Local anesthetic:  Lidocaine 2% WITH epi Laceration details:    Location:  Finger   Finger location:  L index finger   Length (cm):  2 Pre-procedure details:    Preparation:  Patient  was prepped and draped in usual sterile fashion and imaging obtained to evaluate for foreign bodies Exploration:    Wound exploration: wound explored through full range of motion and entire depth of wound visualized   Treatment:    Area cleansed with:  Chlorhexidine   Amount of cleaning:  Standard   Irrigation solution:  Sterile saline   Irrigation method:  Pressure wash Skin repair:    Repair method:  Sutures   Suture size:  4-0   Suture material:  Prolene   Suture technique:  Simple interrupted   Number of sutures:  2 Approximation:    Approximation:  Close Repair type:    Repair type:  Simple Post-procedure details:    Dressing:  Non-adherent dressing   Procedure completion:  Tolerated well, no immediate complications .Marland KitchenLaceration Repair  Date/Time: 05/18/2022 5:23 PM  Performed by: Arthor Captain, PA-C Authorized by: Arthor Captain, PA-C   Consent:    Consent obtained:  Verbal   Consent given by:  Patient   Risks discussed:  Infection, need for additional repair and poor cosmetic result   Alternatives discussed:  No treatment Universal protocol:    Patient identity confirmed:  Verbally with patient Anesthesia:    Anesthesia method:  Local infiltration   Local anesthetic:  Lidocaine 2% WITH epi Laceration details:    Location:  Finger   Finger location:  L thumb   Length (cm):  5 Pre-procedure details:    Preparation:  Patient was prepped and draped in usual sterile fashion Exploration:    Wound extent: tendon damage     Tendon damage location:  Upper extremity   Upper extremity tendon damage location:  Finger extensor   Finger extensor tendon: extensor pollicis.   Tendon damage extent:  Complete transection   Tendon repair plan:  Refer for evaluation   Contaminated: no   Treatment:    Area cleansed with:  Chlorhexidine   Amount of cleaning:  Extensive   Irrigation solution:  Sterile saline   Irrigation method:  Pressure wash   Visualized foreign  bodies/material removed: yes   Skin repair:    Repair method:  Sutures   Suture size:  4-0   Suture material:  Prolene   Suture technique:  Simple interrupted   Number of sutures:  5 Repair type:    Repair type:  Simple Post-procedure details:    Dressing:  Sterile dressing   Procedure completion:  Tolerated well, no immediate complications     Medications Ordered in ED Medications  lidocaine-EPINEPHrine (XYLOCAINE W/EPI) 2 %-1:100000 (with pres) injection 20 mL (20 mLs Intradermal Given 05/18/22 1541)  ceFAZolin (ANCEF) injection 1 g (1 g Intramuscular Given 05/18/22 1641)    ED Course/ Medical Decision Making/ A&P                           Medical Decision Making Patient here with laceration to the L thumb and index finger with extensor injury of the thumb. There was joint capsule involvement so I treated this as if it were an open fractue with ancef and Keflex. She is UTD on Tdap (2018)  Patient has no sensory or vascular deficits. Will be discharged with superficial repair and close hand follow up.  Amount and/or Complexity of Data Reviewed Radiology: ordered and independent interpretation performed. Discussion of management or test interpretation with external provider(s): Case discussed with Dr. Otelia Limes who agrees with care plan and op follow up.  Risk Prescription drug management.           Final Clinical Impression(s) / ED Diagnoses Final diagnoses:  Extensor tendon laceration, finger, open wound, initial encounter    Rx / DC Orders ED Discharge Orders          Ordered    HYDROcodone-acetaminophen (NORCO) 5-325 MG tablet  Every 6 hours PRN,   Status:  Discontinued        05/18/22 1729    cephALEXin (KEFLEX) 500 MG capsule  05/18/22 1729    HYDROcodone-acetaminophen (NORCO) 5-325 MG tablet  Every 6 hours PRN        05/18/22 1730              Arthor CaptainHarris, Jonny Dearden, PA-C 05/19/22 2030    Mardene SayerBranham, Victoria C, MD 05/20/22 [phone]

## 2023-06-07 ENCOUNTER — Other Ambulatory Visit: Payer: Self-pay | Admitting: Orthopedic Surgery

## 2023-06-07 DIAGNOSIS — M4325 Fusion of spine, thoracolumbar region: Secondary | ICD-10-CM

## 2023-06-08 ENCOUNTER — Ambulatory Visit
Admission: RE | Admit: 2023-06-08 | Discharge: 2023-06-08 | Disposition: A | Discharge status: Home / Self Care | Payer: BC Managed Care – PPO | Source: Ambulatory Visit | Attending: Orthopedic Surgery | Admitting: Orthopedic Surgery

## 2023-06-08 DIAGNOSIS — M4325 Fusion of spine, thoracolumbar region: Secondary | ICD-10-CM

## 2023-07-26 ENCOUNTER — Other Ambulatory Visit (HOSPITAL_BASED_OUTPATIENT_CLINIC_OR_DEPARTMENT_OTHER): Payer: Self-pay | Admitting: Family Medicine

## 2023-07-26 DIAGNOSIS — M8580 Other specified disorders of bone density and structure, unspecified site: Secondary | ICD-10-CM

## 2023-07-31 ENCOUNTER — Ambulatory Visit (HOSPITAL_BASED_OUTPATIENT_CLINIC_OR_DEPARTMENT_OTHER)
Admission: RE | Admit: 2023-07-31 | Discharge: 2023-07-31 | Disposition: A | Discharge status: Home / Self Care | Payer: BC Managed Care – PPO | Source: Ambulatory Visit | Attending: Family Medicine | Admitting: Family Medicine

## 2023-07-31 DIAGNOSIS — M8580 Other specified disorders of bone density and structure, unspecified site: Secondary | ICD-10-CM | POA: Diagnosis present

## 2023-11-20 ENCOUNTER — Other Ambulatory Visit: Payer: Self-pay | Admitting: Obstetrics and Gynecology

## 2023-11-20 DIAGNOSIS — Z0000 Encounter for general adult medical examination without abnormal findings: Secondary | ICD-10-CM

## 2024-05-08 ENCOUNTER — Ambulatory Visit
Admission: RE | Admit: 2024-05-08 | Discharge: 2024-05-08 | Disposition: A | Discharge status: Home / Self Care | Source: Ambulatory Visit | Attending: Obstetrics and Gynecology | Admitting: Obstetrics and Gynecology

## 2024-05-08 DIAGNOSIS — Z0000 Encounter for general adult medical examination without abnormal findings: Secondary | ICD-10-CM
